# Patient Record
Sex: Female | Born: 1945 | Race: White | Hispanic: No
Health system: Southern US, Community
[De-identification: ages and names within clinical notes are randomized; demographics above are authoritative.]

## PROBLEM LIST (undated history)

## (undated) DIAGNOSIS — F329 Major depressive disorder, single episode, unspecified: Secondary | ICD-10-CM

## (undated) DIAGNOSIS — S7222XA Displaced subtrochanteric fracture of left femur, initial encounter for closed fracture: Secondary | ICD-10-CM

## (undated) DIAGNOSIS — H9190 Unspecified hearing loss, unspecified ear: Secondary | ICD-10-CM

## (undated) DIAGNOSIS — M1990 Unspecified osteoarthritis, unspecified site: Secondary | ICD-10-CM

## (undated) DIAGNOSIS — L509 Urticaria, unspecified: Secondary | ICD-10-CM

## (undated) DIAGNOSIS — I10 Essential (primary) hypertension: Secondary | ICD-10-CM

## (undated) DIAGNOSIS — F32A Depression, unspecified: Secondary | ICD-10-CM

## (undated) HISTORY — DX: Essential (primary) hypertension: I10

## (undated) HISTORY — DX: Displaced subtrochanteric fracture of left femur, initial encounter for closed fracture: S72.22XA

## (undated) HISTORY — PX: NO PAST SURGERIES: SHX2092

## (undated) HISTORY — DX: Urticaria, unspecified: L50.9

## (undated) HISTORY — DX: Depression, unspecified: F32.A

---
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
## 2009-07-17 ENCOUNTER — Encounter: Payer: Self-pay | Admitting: Physician Assistant

## 2014-04-03 ENCOUNTER — Telehealth: Payer: Self-pay | Admitting: Family Medicine

## 2015-02-14 ENCOUNTER — Encounter: Payer: Self-pay | Admitting: Physician Assistant

## 2015-07-04 DIAGNOSIS — J3089 Other allergic rhinitis: Secondary | ICD-10-CM | POA: Diagnosis not present

## 2015-07-18 DIAGNOSIS — J3089 Other allergic rhinitis: Secondary | ICD-10-CM | POA: Diagnosis not present

## 2015-08-01 DIAGNOSIS — J3089 Other allergic rhinitis: Secondary | ICD-10-CM | POA: Diagnosis not present

## 2015-08-15 DIAGNOSIS — J3089 Other allergic rhinitis: Secondary | ICD-10-CM | POA: Diagnosis not present

## 2015-08-22 DIAGNOSIS — J3089 Other allergic rhinitis: Secondary | ICD-10-CM | POA: Diagnosis not present

## 2015-09-06 DIAGNOSIS — J3089 Other allergic rhinitis: Secondary | ICD-10-CM | POA: Diagnosis not present

## 2015-09-19 DIAGNOSIS — J3089 Other allergic rhinitis: Secondary | ICD-10-CM | POA: Diagnosis not present

## 2015-09-26 DIAGNOSIS — J3089 Other allergic rhinitis: Secondary | ICD-10-CM | POA: Diagnosis not present

## 2015-10-10 DIAGNOSIS — J3089 Other allergic rhinitis: Secondary | ICD-10-CM | POA: Diagnosis not present

## 2015-10-25 DIAGNOSIS — J3089 Other allergic rhinitis: Secondary | ICD-10-CM | POA: Diagnosis not present

## 2015-10-31 DIAGNOSIS — J3089 Other allergic rhinitis: Secondary | ICD-10-CM | POA: Diagnosis not present

## 2015-11-06 DIAGNOSIS — H1045 Other chronic allergic conjunctivitis: Secondary | ICD-10-CM | POA: Diagnosis not present

## 2015-11-06 DIAGNOSIS — J3089 Other allergic rhinitis: Secondary | ICD-10-CM | POA: Diagnosis not present

## 2015-11-06 DIAGNOSIS — R05 Cough: Secondary | ICD-10-CM | POA: Diagnosis not present

## 2015-11-06 DIAGNOSIS — R21 Rash and other nonspecific skin eruption: Secondary | ICD-10-CM | POA: Diagnosis not present

## 2015-11-18 DIAGNOSIS — J3089 Other allergic rhinitis: Secondary | ICD-10-CM | POA: Diagnosis not present

## 2015-11-28 DIAGNOSIS — J3089 Other allergic rhinitis: Secondary | ICD-10-CM | POA: Diagnosis not present

## 2015-12-06 DIAGNOSIS — J3089 Other allergic rhinitis: Secondary | ICD-10-CM | POA: Diagnosis not present

## 2015-12-09 DIAGNOSIS — J3089 Other allergic rhinitis: Secondary | ICD-10-CM | POA: Diagnosis not present

## 2015-12-19 DIAGNOSIS — J3089 Other allergic rhinitis: Secondary | ICD-10-CM | POA: Diagnosis not present

## 2015-12-25 ENCOUNTER — Encounter: Payer: Self-pay | Admitting: Physician Assistant

## 2015-12-25 DIAGNOSIS — H538 Other visual disturbances: Secondary | ICD-10-CM | POA: Diagnosis not present

## 2015-12-25 DIAGNOSIS — H04123 Dry eye syndrome of bilateral lacrimal glands: Secondary | ICD-10-CM | POA: Diagnosis not present

## 2016-01-02 DIAGNOSIS — J3089 Other allergic rhinitis: Secondary | ICD-10-CM | POA: Diagnosis not present

## 2016-01-09 DIAGNOSIS — J3089 Other allergic rhinitis: Secondary | ICD-10-CM | POA: Diagnosis not present

## 2016-01-23 DIAGNOSIS — J3089 Other allergic rhinitis: Secondary | ICD-10-CM | POA: Diagnosis not present

## 2016-02-04 DIAGNOSIS — J3089 Other allergic rhinitis: Secondary | ICD-10-CM | POA: Diagnosis not present

## 2016-02-13 DIAGNOSIS — J3089 Other allergic rhinitis: Secondary | ICD-10-CM | POA: Diagnosis not present

## 2016-02-20 DIAGNOSIS — J3089 Other allergic rhinitis: Secondary | ICD-10-CM | POA: Diagnosis not present

## 2016-02-27 DIAGNOSIS — J3089 Other allergic rhinitis: Secondary | ICD-10-CM | POA: Diagnosis not present

## 2016-03-05 DIAGNOSIS — J3089 Other allergic rhinitis: Secondary | ICD-10-CM | POA: Diagnosis not present

## 2016-03-17 DIAGNOSIS — J3089 Other allergic rhinitis: Secondary | ICD-10-CM | POA: Diagnosis not present

## 2016-03-17 DIAGNOSIS — H1045 Other chronic allergic conjunctivitis: Secondary | ICD-10-CM | POA: Diagnosis not present

## 2016-03-17 DIAGNOSIS — R05 Cough: Secondary | ICD-10-CM | POA: Diagnosis not present

## 2016-03-17 DIAGNOSIS — R21 Rash and other nonspecific skin eruption: Secondary | ICD-10-CM | POA: Diagnosis not present

## 2016-03-19 ENCOUNTER — Other Ambulatory Visit: Payer: Self-pay | Admitting: Physician Assistant

## 2016-03-26 DIAGNOSIS — J3089 Other allergic rhinitis: Secondary | ICD-10-CM | POA: Diagnosis not present

## 2016-04-06 ENCOUNTER — Other Ambulatory Visit: Payer: Self-pay | Admitting: Physician Assistant

## 2016-04-10 DIAGNOSIS — J3089 Other allergic rhinitis: Secondary | ICD-10-CM | POA: Diagnosis not present

## 2016-04-16 DIAGNOSIS — J3089 Other allergic rhinitis: Secondary | ICD-10-CM | POA: Diagnosis not present

## 2016-04-30 DIAGNOSIS — J3089 Other allergic rhinitis: Secondary | ICD-10-CM | POA: Diagnosis not present

## 2016-05-07 DIAGNOSIS — J3089 Other allergic rhinitis: Secondary | ICD-10-CM | POA: Diagnosis not present

## 2016-05-18 ENCOUNTER — Other Ambulatory Visit: Payer: Self-pay | Admitting: Physician Assistant

## 2016-05-22 DIAGNOSIS — J3089 Other allergic rhinitis: Secondary | ICD-10-CM | POA: Diagnosis not present

## 2016-05-28 DIAGNOSIS — J3089 Other allergic rhinitis: Secondary | ICD-10-CM | POA: Diagnosis not present

## 2016-06-11 DIAGNOSIS — J3089 Other allergic rhinitis: Secondary | ICD-10-CM | POA: Diagnosis not present

## 2016-06-22 DIAGNOSIS — J3089 Other allergic rhinitis: Secondary | ICD-10-CM | POA: Diagnosis not present

## 2016-07-10 DIAGNOSIS — J3089 Other allergic rhinitis: Secondary | ICD-10-CM | POA: Diagnosis not present

## 2016-07-20 ENCOUNTER — Other Ambulatory Visit: Payer: Self-pay | Admitting: Physician Assistant

## 2016-07-23 DIAGNOSIS — J3089 Other allergic rhinitis: Secondary | ICD-10-CM | POA: Diagnosis not present

## 2016-07-31 DIAGNOSIS — J3089 Other allergic rhinitis: Secondary | ICD-10-CM | POA: Diagnosis not present

## 2016-08-03 ENCOUNTER — Other Ambulatory Visit: Payer: Self-pay | Admitting: Physician Assistant

## 2016-08-04 ENCOUNTER — Other Ambulatory Visit: Payer: Self-pay | Admitting: Physician Assistant

## 2016-08-06 DIAGNOSIS — J3089 Other allergic rhinitis: Secondary | ICD-10-CM | POA: Diagnosis not present

## 2016-08-12 DIAGNOSIS — J3089 Other allergic rhinitis: Secondary | ICD-10-CM | POA: Diagnosis not present

## 2016-08-20 DIAGNOSIS — J3089 Other allergic rhinitis: Secondary | ICD-10-CM | POA: Diagnosis not present

## 2016-08-21 ENCOUNTER — Other Ambulatory Visit: Payer: Self-pay | Admitting: Physician Assistant

## 2016-09-03 ENCOUNTER — Other Ambulatory Visit: Payer: Self-pay | Admitting: Physician Assistant

## 2016-09-03 DIAGNOSIS — J3089 Other allergic rhinitis: Secondary | ICD-10-CM | POA: Diagnosis not present

## 2016-09-17 DIAGNOSIS — J3089 Other allergic rhinitis: Secondary | ICD-10-CM | POA: Diagnosis not present

## 2016-09-18 ENCOUNTER — Other Ambulatory Visit: Payer: Self-pay | Admitting: Physician Assistant

## 2016-09-29 ENCOUNTER — Other Ambulatory Visit: Payer: Self-pay | Admitting: Physician Assistant

## 2016-10-02 DIAGNOSIS — J3089 Other allergic rhinitis: Secondary | ICD-10-CM | POA: Diagnosis not present

## 2016-10-13 DIAGNOSIS — J3089 Other allergic rhinitis: Secondary | ICD-10-CM | POA: Diagnosis not present

## 2016-10-19 DIAGNOSIS — J3089 Other allergic rhinitis: Secondary | ICD-10-CM | POA: Diagnosis not present

## 2016-10-23 ENCOUNTER — Other Ambulatory Visit: Payer: Self-pay | Admitting: Physician Assistant

## 2016-10-30 ENCOUNTER — Other Ambulatory Visit: Payer: Self-pay | Admitting: Physician Assistant

## 2016-11-04 ENCOUNTER — Ambulatory Visit: Payer: Self-pay | Admitting: Physician Assistant

## 2016-11-05 DIAGNOSIS — J3089 Other allergic rhinitis: Secondary | ICD-10-CM | POA: Diagnosis not present

## 2016-11-12 ENCOUNTER — Ambulatory Visit (INDEPENDENT_AMBULATORY_CARE_PROVIDER_SITE_OTHER): Payer: Medicare Other | Admitting: Physician Assistant

## 2016-11-12 ENCOUNTER — Encounter: Payer: Self-pay | Admitting: Physician Assistant

## 2016-11-12 VITALS — BP 136/84 | HR 71 | Temp 97.3°F | Ht 62.0 in | Wt 155.0 lb

## 2016-11-12 DIAGNOSIS — K219 Gastro-esophageal reflux disease without esophagitis: Secondary | ICD-10-CM | POA: Diagnosis not present

## 2016-11-12 DIAGNOSIS — Z0000 Encounter for general adult medical examination without abnormal findings: Secondary | ICD-10-CM

## 2016-11-12 DIAGNOSIS — M2170 Unequal limb length (acquired), unspecified site: Secondary | ICD-10-CM | POA: Insufficient documentation

## 2016-11-12 DIAGNOSIS — J3089 Other allergic rhinitis: Secondary | ICD-10-CM

## 2016-11-12 DIAGNOSIS — F339 Major depressive disorder, recurrent, unspecified: Secondary | ICD-10-CM

## 2016-11-12 DIAGNOSIS — I10 Essential (primary) hypertension: Secondary | ICD-10-CM

## 2016-11-12 DIAGNOSIS — L719 Rosacea, unspecified: Secondary | ICD-10-CM

## 2016-11-12 DIAGNOSIS — M41115 Juvenile idiopathic scoliosis, thoracolumbar region: Secondary | ICD-10-CM

## 2016-11-12 MED ORDER — CITALOPRAM HYDROBROMIDE 40 MG PO TABS: 60.0000 mg | ORAL_TABLET | Freq: Every day | ORAL | 3 refills | Status: DC

## 2016-11-12 MED ORDER — MELOXICAM 7.5 MG PO TABS: 7.5000 mg | ORAL_TABLET | Freq: Every day | ORAL | 1 refills | Status: DC

## 2016-11-12 MED ORDER — LOSARTAN POTASSIUM 25 MG PO TABS: 25.0000 mg | ORAL_TABLET | Freq: Every day | ORAL | 3 refills | Status: DC

## 2016-11-12 MED ORDER — TIZANIDINE HCL 4 MG PO TABS: 4.0000 mg | ORAL_TABLET | Freq: Three times a day (TID) | ORAL | 0 refills | Status: DC | PRN

## 2016-11-12 MED ORDER — OMEPRAZOLE 20 MG PO CPDR: DELAYED_RELEASE_CAPSULE | ORAL | 3 refills | Status: DC

## 2016-11-12 MED ORDER — METRONIDAZOLE 0.75 % EX CREA: TOPICAL_CREAM | Freq: Two times a day (BID) | CUTANEOUS | 11 refills | Status: DC

## 2016-11-12 NOTE — Patient Instructions (Signed)
Stress and Stress Management Stress is a normal reaction to life events. It is what you feel when life demands more than you are used to or more than you can handle. Some stress can be useful. For example, the stress reaction can help you catch the last bus of the day, study for a test, or meet a deadline at work. But stress that occurs too often or for too long can cause problems. It can affect your emotional health and interfere with relationships and normal daily activities. Too much stress can weaken your immune system and increase your risk for physical illness. If you already have a medical problem, stress can make it worse. What are the causes? All sorts of life events may cause stress. An event that causes stress for one person may not be stressful for another person. Major life events commonly cause stress. These may be positive or negative. Examples include losing your job, moving into a new home, getting married, having a baby, or losing a loved one. Less obvious life events may also cause stress, especially if they occur day after day or in combination. Examples include working long hours, driving in traffic, caring for children, being in debt, or being in a difficult relationship. What are the signs or symptoms? Stress may cause emotional symptoms including, the following:  Anxiety. This is feeling worried, afraid, on edge, overwhelmed, or out of control.  Anger. This is feeling irritated or impatient.  Depression. This is feeling sad, down, helpless, or guilty.  Difficulty focusing, remembering, or making decisions.  Stress may cause physical symptoms, including the following:  Aches and pains. These may affect your head, neck, back, stomach, or other areas of your body.  Tight muscles or clenched jaw.  Low energy or trouble sleeping.  Stress may cause unhealthy behaviors, including the following:  Eating to feel better (overeating) or skipping meals.  Sleeping too little,  too much, or both.  Working too much or putting off tasks (procrastination).  Smoking, drinking alcohol, or using drugs to feel better.  How is this diagnosed? Stress is diagnosed through an assessment by your health care provider. Your health care provider will ask questions about your symptoms and any stressful life events.Your health care provider will also ask about your medical history and may order blood tests or other tests. Certain medical conditions and medicine can cause physical symptoms similar to stress. Mental illness can cause emotional symptoms and unhealthy behaviors similar to stress. Your health care provider may refer you to a mental health professional for further evaluation. How is this treated? Stress management is the recommended treatment for stress.The goals of stress management are reducing stressful life events and coping with stress in healthy ways. Techniques for reducing stressful life events include the following:  Stress identification. Self-monitor for stress and identify what causes stress for you. These skills may help you to avoid some stressful events.  Time management. Set your priorities, keep a calendar of events, and learn to say "no." These tools can help you avoid making too many commitments.  Techniques for coping with stress include the following:  Rethinking the problem. Try to think realistically about stressful events rather than ignoring them or overreacting. Try to find the positives in a stressful situation rather than focusing on the negatives.  Exercise. Physical exercise can release both physical and emotional tension. The key is to find a form of exercise you enjoy and do it regularly.  Relaxation techniques. These relax the body and  mind. Examples include yoga, meditation, tai chi, biofeedback, deep breathing, progressive muscle relaxation, listening to music, being out in nature, journaling, and other hobbies. Again, the key is to find  one or more that you enjoy and can do regularly.  Healthy lifestyle. Eat a balanced diet, get plenty of sleep, and do not smoke. Avoid using alcohol or drugs to relax.  Strong support network. Spend time with family, friends, or other people you enjoy being around.Express your feelings and talk things over with someone you trust.  Counseling or talktherapy with a mental health professional may be helpful if you are having difficulty managing stress on your own. Medicine is typically not recommended for the treatment of stress.Talk to your health care provider if you think you need medicine for symptoms of stress. Follow these instructions at home:  Keep all follow-up visits as directed by your health care provider.  Take all medicines as directed by your health care provider. Contact a health care provider if:  Your symptoms get worse or you start having new symptoms.  You feel overwhelmed by your problems and can no longer manage them on your own. Get help right away if:  You feel like hurting yourself or someone else. This information is not intended to replace advice given to you by your health care provider. Make sure you discuss any questions you have with your health care provider. Document Released: 11/11/2000 Document Revised: 10/24/2015 Document Reviewed: 01/10/2013 Elsevier Interactive Patient Education  2017 [employer] Inc.  

## 2016-11-13 LAB — CMP14+EGFR
ALT: 15 IU/L (ref 0–32)
AST: 20 IU/L (ref 0–40)
Albumin/Globulin Ratio: 1.7 (ref 1.2–2.2)
Albumin: 4.6 g/dL (ref 3.5–4.8)
Alkaline Phosphatase: 71 IU/L (ref 39–117)
BUN/Creatinine Ratio: 16 (ref 12–28)
BUN: 12 mg/dL (ref 8–27)
Bilirubin Total: 0.5 mg/dL (ref 0.0–1.2)
CO2: 23 mmol/L (ref 20–29)
Calcium: 9.7 mg/dL (ref 8.7–10.3)
Chloride: 102 mmol/L (ref 96–106)
Creatinine, Ser: 0.76 mg/dL (ref 0.57–1.00)
GFR calc Af Amer: 91 mL/min/{1.73_m2} (ref >59)
GFR calc non Af Amer: 79 mL/min/{1.73_m2} (ref >59)
Globulin, Total: 2.7 g/dL (ref 1.5–4.5)
Glucose: 80 mg/dL (ref 65–99)
Potassium: 4.1 mmol/L (ref 3.5–5.2)
Sodium: 141 mmol/L (ref 134–144)
Total Protein: 7.3 g/dL (ref 6.0–8.5)

## 2016-11-13 LAB — CBC WITH DIFFERENTIAL/PLATELET
Basophils Absolute: 0.1 10*3/uL (ref 0.0–0.2)
Basos: 1 %
EOS (ABSOLUTE): 0.1 10*3/uL (ref 0.0–0.4)
Eos: 1 %
Hematocrit: 41 % (ref 34.0–46.6)
Hemoglobin: 13.5 g/dL (ref 11.1–15.9)
Immature Grans (Abs): 0 10*3/uL (ref 0.0–0.1)
Immature Granulocytes: 0 %
Lymphocytes Absolute: 1.9 10*3/uL (ref 0.7–3.1)
Lymphs: 27 %
MCH: 29 pg (ref 26.6–33.0)
MCHC: 32.9 g/dL (ref 31.5–35.7)
MCV: 88 fL (ref 79–97)
Monocytes Absolute: 0.8 10*3/uL (ref 0.1–0.9)
Monocytes: 11 %
Neutrophils Absolute: 4.3 10*3/uL (ref 1.4–7.0)
Neutrophils: 60 %
Platelets: 233 10*3/uL (ref 150–379)
RBC: 4.65 x10E6/uL (ref 3.77–5.28)
RDW: 13.6 % (ref 12.3–15.4)
WBC: 7.2 10*3/uL (ref 3.4–10.8)

## 2016-11-13 LAB — LIPID PANEL
Chol/HDL Ratio: 3.7 ratio (ref 0.0–4.4)
Cholesterol, Total: 180 mg/dL (ref 100–199)
HDL: 49 mg/dL (ref >39)
LDL Calculated: 93 mg/dL (ref 0–99)
Triglycerides: 191 mg/dL — ABNORMAL HIGH (ref 0–149)
VLDL Cholesterol Cal: 38 mg/dL (ref 5–40)

## 2016-11-13 LAB — TSH: TSH: 2.37 u[IU]/mL (ref 0.450–4.500)

## 2016-11-15 NOTE — Progress Notes (Signed)
BP 136/84   Pulse 71   Temp 97.3 F (36.3 C) (Oral)   Ht _0  (1.575 m)   Wt 155 lb (70.3 kg)   BMI 28.35 kg/m    Subjective:    Patient ID: Savannah Dean, female    DOB: 15-Aug-1945, 71 y.o.   MRN: [id number]  HPI: Savannah Dean is a 72 y.o. female presenting on 11/12/2016 for Establish Care  This patient comes in for periodic recheck on medications and conditions including scoliosis, chronic pain, depression, GERD, hypertension, acne rosacea, allergic rhinitis,.  She needs labs updated and her medications refilled.   All medications are reviewed today. There are no reports of any problems with the medications. All of the medical conditions are reviewed and updated.  Lab work is reviewed and will be ordered as medically necessary. There are no new problems reported with today's visit.   Relevant past medical, surgical, family and social history reviewed and updated as indicated. Allergies and medications reviewed and updated.  No past medical history on file.  Past Surgical History:  Procedure Laterality Date  . SPINAL FUSION  1950    Review of Systems  Constitutional: Negative.  Negative for activity change, fatigue and fever.  HENT: Positive for congestion and postnasal drip.   Eyes: Negative.   Respiratory: Negative.  Negative for cough.   Cardiovascular: Negative.  Negative for chest pain.  Gastrointestinal: Negative.  Negative for abdominal pain.  Endocrine: Negative.   Genitourinary: Negative.  Negative for dysuria.  Musculoskeletal: Positive for arthralgias, gait problem and joint swelling.  Skin: Negative.     Allergies as of 11/12/2016   Not on File     Medication List       Accurate as of 11/12/16 11:59 PM. Always use your most recent med list.          citalopram 40 MG tablet Commonly known as:  CELEXA Take 1.5 tablets (60 mg total) by mouth daily.   losartan 25 MG tablet Commonly known as:  COZAAR Take 1 tablet (25 mg total) by mouth  daily.   meloxicam 7.5 MG tablet Commonly known as:  MOBIC Take 1 tablet (7.5 mg total) by mouth daily.   metroNIDAZOLE 0.75 % cream Commonly known as:  METROCREAM Apply topically 2 (two) times daily.   omeprazole 20 MG capsule Commonly known as:  PRILOSEC TAKE ONE (1) CAPSULE EACH DAY   tiZANidine 4 MG tablet Commonly known as:  ZANAFLEX Take 1 tablet (4 mg total) by mouth every 8 (eight) hours as needed.          Objective:    BP 136/84   Pulse 71   Temp 97.3 F (36.3 C) (Oral)   Ht _1  (1.575 m)   Wt 155 lb (70.3 kg)   BMI 28.35 kg/m   Not on File  Physical Exam  Constitutional: She is oriented to person, place, and time. She appears well-developed and well-nourished.  HENT:  Head: Normocephalic and atraumatic.  Right Ear: Tympanic membrane, external ear and ear canal normal.  Left Ear: Tympanic membrane, external ear and ear canal normal.  Nose: Nose normal. No rhinorrhea.  Mouth/Throat: Oropharynx is clear and moist and mucous membranes are normal. No oropharyngeal exudate or posterior oropharyngeal erythema.  Eyes: Conjunctivae and EOM are normal. Pupils are equal, round, and reactive to light.  Neck: Normal range of motion. Neck supple.  Cardiovascular: Normal rate, regular rhythm, normal heart sounds and intact distal pulses.   Pulmonary/Chest:  Effort normal and breath sounds normal.  Abdominal: Soft. Bowel sounds are normal.  Neurological: She is alert and oriented to person, place, and time. She has normal reflexes.  Skin: Skin is warm and dry. No rash noted.  Psychiatric: She has a normal mood and affect. Her behavior is normal. Judgment and thought content normal.  Nursing note and vitals reviewed.   Results for orders placed or performed in visit on 11/12/16  CBC with Differential/Platelet  Result Value Ref Range   WBC 7.2 3.4 - 10.8 x10E3/uL   RBC 4.65 3.77 - 5.28 x10E6/uL   Hemoglobin 13.5 11.1 - 15.9 g/dL   Hematocrit 41.0 34.0 - 46.6 %    MCV 88 79 - 97 fL   MCH 29.0 26.6 - 33.0 pg   MCHC 32.9 31.5 - 35.7 g/dL   RDW 13.6 12.3 - 15.4 %   Platelets 233 150 - 379 x10E3/uL   Neutrophils 60 Not Estab. %   Lymphs 27 Not Estab. %   Monocytes 11 Not Estab. %   Eos 1 Not Estab. %   Basos 1 Not Estab. %   Neutrophils Absolute 4.3 1.4 - 7.0 x10E3/uL   Lymphocytes Absolute 1.9 0.7 - 3.1 x10E3/uL   Monocytes Absolute 0.8 0.1 - 0.9 x10E3/uL   EOS (ABSOLUTE) 0.1 0.0 - 0.4 x10E3/uL   Basophils Absolute 0.1 0.0 - 0.2 x10E3/uL   Immature Granulocytes 0 Not Estab. %   Immature Grans (Abs) 0.0 0.0 - 0.1 x10E3/uL  CMP14+EGFR  Result Value Ref Range   Glucose 80 65 - 99 mg/dL   BUN 12 8 - 27 mg/dL   Creatinine, Ser 0.76 0.57 - 1.00 mg/dL   GFR calc non Af Amer 79 >59 mL/min/1.73   GFR calc Af Amer 91 >59 mL/min/1.73   BUN/Creatinine Ratio 16 12 - 28   Sodium 141 134 - 144 mmol/L   Potassium 4.1 3.5 - 5.2 mmol/L   Chloride 102 96 - 106 mmol/L   CO2 23 20 - 29 mmol/L   Calcium 9.7 8.7 - 10.3 mg/dL   Total Protein 7.3 6.0 - 8.5 g/dL   Albumin 4.6 3.5 - 4.8 g/dL   Globulin, Total 2.7 1.5 - 4.5 g/dL   Albumin/Globulin Ratio 1.7 1.2 - 2.2   Bilirubin Total 0.5 0.0 - 1.2 mg/dL   Alkaline Phosphatase 71 39 - 117 IU/L   AST 20 0 - 40 IU/L   ALT 15 0 - 32 IU/L  Lipid panel  Result Value Ref Range   Cholesterol, Total 180 100 - 199 mg/dL   Triglycerides 191 (H) 0 - 149 mg/dL   HDL 49 >39 mg/dL   VLDL Cholesterol Cal 38 5 - 40 mg/dL   LDL Calculated 93 0 - 99 mg/dL   Chol/HDL Ratio 3.7 0.0 - 4.4 ratio  TSH  Result Value Ref Range   TSH 2.370 0.450 - 4.500 uIU/mL      Assessment & Plan:   1. Essential hypertension - losartan (COZAAR) 25 MG tablet; Take 1 tablet (25 mg total) by mouth daily.  Dispense: 90 tablet; Refill: 3 - CBC with Differential/Platelet - CMP14+EGFR - Lipid panel - TSH  2. Depression, recurrent ([place]) - citalopram (CELEXA) 40 MG tablet; Take 1.5 tablets (60 mg total) by mouth daily.  Dispense: 90 tablet;  Refill: 3  3. Gastroesophageal reflux disease without esophagitis - omeprazole (PRILOSEC) 20 MG capsule; TAKE ONE (1) CAPSULE EACH DAY  Dispense: 90 capsule; Refill: 3 - CBC with Differential/Platelet - CMP14+EGFR  4. Juvenile idiopathic scoliosis of thoracolumbar region - meloxicam (MOBIC) 7.5 MG tablet; Take 1 tablet (7.5 mg total) by mouth daily.  Dispense: 90 tablet; Refill: 1 - tiZANidine (ZANAFLEX) 4 MG tablet; Take 1 tablet (4 mg total) by mouth every 8 (eight) hours as needed.  Dispense: 30 tablet; Refill: 0  5. Leg length discrepancy  6. Acne rosacea - metroNIDAZOLE (METROCREAM) 0.75 % cream; Apply topically 2 (two) times daily.  Dispense: 45 g; Refill: 11  7. Non-seasonal allergic rhinitis, unspecified trigger  8. Well adult exam - CBC with Differential/Platelet - CMP14+EGFR - Lipid panel - TSH   Current Outpatient Prescriptions:  .  citalopram (CELEXA) 40 MG tablet, Take 1.5 tablets (60 mg total) by mouth daily., Disp: 90 tablet, Rfl: 3 .  losartan (COZAAR) 25 MG tablet, Take 1 tablet (25 mg total) by mouth daily., Disp: 90 tablet, Rfl: 3 .  meloxicam (MOBIC) 7.5 MG tablet, Take 1 tablet (7.5 mg total) by mouth daily., Disp: 90 tablet, Rfl: 1 .  metroNIDAZOLE (METROCREAM) 0.75 % cream, Apply topically 2 (two) times daily., Disp: 45 g, Rfl: 11 .  omeprazole (PRILOSEC) 20 MG capsule, TAKE ONE (1) CAPSULE EACH DAY, Disp: 90 capsule, Rfl: 3 .  tiZANidine (ZANAFLEX) 4 MG tablet, Take 1 tablet (4 mg total) by mouth every 8 (eight) hours as needed., Disp: 30 tablet, Rfl: 0  Continue all other maintenance medications as listed above.  Follow up plan: Return in about 6 months (around 05/14/2017) for recheck.  Educational handout given for [place] PA-C [place] [address]  [place], [place] [address] [phone]   11/15/2016, 10:36 PM

## 2016-11-19 DIAGNOSIS — J3089 Other allergic rhinitis: Secondary | ICD-10-CM | POA: Diagnosis not present

## 2016-12-03 DIAGNOSIS — J3089 Other allergic rhinitis: Secondary | ICD-10-CM | POA: Diagnosis not present

## 2016-12-17 ENCOUNTER — Telehealth: Payer: Self-pay | Admitting: Physician Assistant

## 2016-12-17 DIAGNOSIS — J3089 Other allergic rhinitis: Secondary | ICD-10-CM | POA: Diagnosis not present

## 2016-12-17 NOTE — Telephone Encounter (Signed)
Patient advised that she has refills at the drug store.

## 2016-12-17 NOTE — Telephone Encounter (Signed)
What is the name of the medication? citalopram  Have you contacted your pharmacy to request a refill? yes  Which pharmacy would you like this sent to? The drug store in [place].   Patient notified that their request is being sent to the clinical staff for review and that they should receive a call once it is complete. If they do not receive a call within 24 hours they can check with their pharmacy or our office.

## 2016-12-31 DIAGNOSIS — J3089 Other allergic rhinitis: Secondary | ICD-10-CM | POA: Diagnosis not present

## 2017-01-14 DIAGNOSIS — J3089 Other allergic rhinitis: Secondary | ICD-10-CM | POA: Diagnosis not present

## 2017-01-21 DIAGNOSIS — J3089 Other allergic rhinitis: Secondary | ICD-10-CM | POA: Diagnosis not present

## 2017-02-03 ENCOUNTER — Other Ambulatory Visit: Payer: Self-pay | Admitting: Physician Assistant

## 2017-02-03 DIAGNOSIS — M41115 Juvenile idiopathic scoliosis, thoracolumbar region: Secondary | ICD-10-CM

## 2017-02-04 DIAGNOSIS — J3089 Other allergic rhinitis: Secondary | ICD-10-CM | POA: Diagnosis not present

## 2017-02-05 ENCOUNTER — Other Ambulatory Visit: Payer: Self-pay | Admitting: Physician Assistant

## 2017-02-11 DIAGNOSIS — J3089 Other allergic rhinitis: Secondary | ICD-10-CM | POA: Diagnosis not present

## 2017-02-18 DIAGNOSIS — J3089 Other allergic rhinitis: Secondary | ICD-10-CM | POA: Diagnosis not present

## 2017-02-25 DIAGNOSIS — J3089 Other allergic rhinitis: Secondary | ICD-10-CM | POA: Diagnosis not present

## 2017-03-04 DIAGNOSIS — J3089 Other allergic rhinitis: Secondary | ICD-10-CM | POA: Diagnosis not present

## 2017-03-16 DIAGNOSIS — J3089 Other allergic rhinitis: Secondary | ICD-10-CM | POA: Diagnosis not present

## 2017-03-16 DIAGNOSIS — R21 Rash and other nonspecific skin eruption: Secondary | ICD-10-CM | POA: Diagnosis not present

## 2017-03-16 DIAGNOSIS — R05 Cough: Secondary | ICD-10-CM | POA: Diagnosis not present

## 2017-03-16 DIAGNOSIS — H1045 Other chronic allergic conjunctivitis: Secondary | ICD-10-CM | POA: Diagnosis not present

## 2017-03-23 ENCOUNTER — Encounter: Payer: Self-pay | Admitting: Family Medicine

## 2017-03-23 DIAGNOSIS — Z0000 Encounter for general adult medical examination without abnormal findings: Secondary | ICD-10-CM | POA: Diagnosis not present

## 2017-03-23 DIAGNOSIS — Z1211 Encounter for screening for malignant neoplasm of colon: Secondary | ICD-10-CM | POA: Diagnosis not present

## 2017-04-01 ENCOUNTER — Other Ambulatory Visit: Payer: Self-pay | Admitting: Physician Assistant

## 2017-04-01 DIAGNOSIS — J3089 Other allergic rhinitis: Secondary | ICD-10-CM | POA: Diagnosis not present

## 2017-04-08 DIAGNOSIS — J3089 Other allergic rhinitis: Secondary | ICD-10-CM | POA: Diagnosis not present

## 2017-04-29 DIAGNOSIS — J3089 Other allergic rhinitis: Secondary | ICD-10-CM | POA: Diagnosis not present

## 2017-05-04 ENCOUNTER — Other Ambulatory Visit: Payer: Self-pay | Admitting: Physician Assistant

## 2017-05-04 DIAGNOSIS — M41115 Juvenile idiopathic scoliosis, thoracolumbar region: Secondary | ICD-10-CM

## 2017-05-13 ENCOUNTER — Other Ambulatory Visit: Payer: Self-pay | Admitting: Physician Assistant

## 2017-05-13 DIAGNOSIS — J3089 Other allergic rhinitis: Secondary | ICD-10-CM | POA: Diagnosis not present

## 2017-05-14 ENCOUNTER — Encounter: Payer: Self-pay | Admitting: Physician Assistant

## 2017-05-14 ENCOUNTER — Ambulatory Visit: Payer: Medicare Other | Admitting: Physician Assistant

## 2017-05-14 ENCOUNTER — Other Ambulatory Visit: Payer: Self-pay | Admitting: Physician Assistant

## 2017-05-14 DIAGNOSIS — M41115 Juvenile idiopathic scoliosis, thoracolumbar region: Secondary | ICD-10-CM

## 2017-05-14 DIAGNOSIS — F339 Major depressive disorder, recurrent, unspecified: Secondary | ICD-10-CM | POA: Diagnosis not present

## 2017-05-14 DIAGNOSIS — K219 Gastro-esophageal reflux disease without esophagitis: Secondary | ICD-10-CM

## 2017-05-14 DIAGNOSIS — I10 Essential (primary) hypertension: Secondary | ICD-10-CM

## 2017-05-14 MED ORDER — LOSARTAN POTASSIUM 25 MG PO TABS: 25.0000 mg | ORAL_TABLET | Freq: Every day | ORAL | 3 refills | Status: DC

## 2017-05-14 MED ORDER — MELOXICAM 7.5 MG PO TABS: 7.5000 mg | ORAL_TABLET | Freq: Every day | ORAL | 1 refills | Status: DC

## 2017-05-14 MED ORDER — NYSTATIN-TRIAMCINOLONE 100000-0.1 UNIT/GM-% EX CREA: TOPICAL_CREAM | CUTANEOUS | 5 refills | Status: DC

## 2017-05-14 MED ORDER — CITALOPRAM HYDROBROMIDE 40 MG PO TABS: 60.0000 mg | ORAL_TABLET | Freq: Every day | ORAL | 3 refills | Status: DC

## 2017-05-14 MED ORDER — OMEPRAZOLE 20 MG PO CPDR: DELAYED_RELEASE_CAPSULE | ORAL | 3 refills | Status: DC

## 2017-05-14 NOTE — Patient Instructions (Signed)
In a few days you may receive a survey in the mail or online from [employer] regarding your visit with us today. Please take a moment to fill this out. Your feedback is very important to our whole office. It can help us better understand your needs as well as improve your experience and satisfaction. Thank you for taking your time to complete it. We care about you.  Aliany Fiorenza, PA-C  

## 2017-05-14 NOTE — Progress Notes (Signed)
BP 138/82   Pulse 67   Temp 97.9 F (36.6 C) (Oral)   Ht '5\' 2"'  (1.575 m)   Wt 155 lb 9.6 oz (70.6 kg)   BMI 28.46 kg/m    Subjective:    Patient ID: Savannah Dean, female    DOB: 1946/05/18, 71 y.o.   MRN: [id number]  HPI: Savannah Dean is a 71 y.o. female presenting on 05/14/2017 for Follow-up (6 month ) and Medication Refill  This patient comes in for periodic recheck on medications and conditions including GERD, scoliosis, hypertension, depression.  She is quite stable with all her conditions. There are some medications that need refills.   All medications are reviewed today. There are no reports of any problems with the medications. All of the medical conditions are reviewed and updated.  Lab work is reviewed and will be ordered as medically necessary. There are no new problems reported with today's visit.   Relevant past medical, surgical, family and social history reviewed and updated as indicated. Allergies and medications reviewed and updated.  History reviewed. No pertinent past medical history.  Past Surgical History:  Procedure Laterality Date  . SPINAL FUSION  1950    Review of Systems  Constitutional: Negative.  Negative for activity change, fatigue and fever.  HENT: Negative.   Eyes: Negative.   Respiratory: Negative.  Negative for cough.   Cardiovascular: Negative.  Negative for chest pain.  Gastrointestinal: Negative.  Negative for abdominal pain.  Endocrine: Negative.   Genitourinary: Negative.  Negative for dysuria.  Musculoskeletal: Negative.   Skin: Negative.   Neurological: Negative.     Allergies as of 05/14/2017   No Known Allergies     Medication List        Accurate as of 05/14/17  2:36 PM. Always use your most recent med list.          alendronate 70 MG tablet Commonly known as:  FOSAMAX TAKE 1 TABLET EVERY WEEK   citalopram 40 MG tablet Commonly known as:  CELEXA Take 1.5 tablets (60 mg total) by mouth daily.     losartan 25 MG tablet Commonly known as:  COZAAR Take 1 tablet (25 mg total) by mouth daily.   meloxicam 7.5 MG tablet Commonly known as:  MOBIC Take 1 tablet (7.5 mg total) by mouth daily.   nystatin-triamcinolone cream Commonly known as:  MYCOLOG II APPLY TO AFFECTED AREA TWICE A DAY   omeprazole 20 MG capsule Commonly known as:  PRILOSEC TAKE ONE (1) CAPSULE EACH DAY   tiZANidine 4 MG tablet Commonly known as:  ZANAFLEX TAKE ONE TABLET EVERY 8 HOURS AS NEEDED          Objective:    BP 138/82   Pulse 67   Temp 97.9 F (36.6 C) (Oral)   Ht '5\' 2"'  (1.575 m)   Wt 155 lb 9.6 oz (70.6 kg)   BMI 28.46 kg/m   No Known Allergies  Physical Exam  Constitutional: She is oriented to person, place, and time. She appears well-developed and well-nourished.  HENT:  Head: Normocephalic and atraumatic.  Eyes: Conjunctivae and EOM are normal. Pupils are equal, round, and reactive to light.  Cardiovascular: Normal rate, regular rhythm, normal heart sounds and intact distal pulses.  Pulmonary/Chest: Effort normal and breath sounds normal.  Abdominal: Soft. Bowel sounds are normal.  Neurological: She is alert and oriented to person, place, and time. She has normal reflexes.  Skin: Skin is warm and dry. No rash noted.  Psychiatric: She has a normal mood and affect. Her behavior is normal. Judgment and thought content normal.  Nursing note and vitals reviewed.   Results for orders placed or performed in visit on 11/12/16  CBC with Differential/Platelet  Result Value Ref Range   WBC 7.2 3.4 - 10.8 x10E3/uL   RBC 4.65 3.77 - 5.28 x10E6/uL   Hemoglobin 13.5 11.1 - 15.9 g/dL   Hematocrit 41.0 34.0 - 46.6 %   MCV 88 79 - 97 fL   MCH 29.0 26.6 - 33.0 pg   MCHC 32.9 31.5 - 35.7 g/dL   RDW 13.6 12.3 - 15.4 %   Platelets 233 150 - 379 x10E3/uL   Neutrophils 60 Not Estab. %   Lymphs 27 Not Estab. %   Monocytes 11 Not Estab. %   Eos 1 Not Estab. %   Basos 1 Not Estab. %    Neutrophils Absolute 4.3 1.4 - 7.0 x10E3/uL   Lymphocytes Absolute 1.9 0.7 - 3.1 x10E3/uL   Monocytes Absolute 0.8 0.1 - 0.9 x10E3/uL   EOS (ABSOLUTE) 0.1 0.0 - 0.4 x10E3/uL   Basophils Absolute 0.1 0.0 - 0.2 x10E3/uL   Immature Granulocytes 0 Not Estab. %   Immature Grans (Abs) 0.0 0.0 - 0.1 x10E3/uL  CMP14+EGFR  Result Value Ref Range   Glucose 80 65 - 99 mg/dL   BUN 12 8 - 27 mg/dL   Creatinine, Ser 0.76 0.57 - 1.00 mg/dL   GFR calc non Af Amer 79 >59 mL/min/1.73   GFR calc Af Amer 91 >59 mL/min/1.73   BUN/Creatinine Ratio 16 12 - 28   Sodium 141 134 - 144 mmol/L   Potassium 4.1 3.5 - 5.2 mmol/L   Chloride 102 96 - 106 mmol/L   CO2 23 20 - 29 mmol/L   Calcium 9.7 8.7 - 10.3 mg/dL   Total Protein 7.3 6.0 - 8.5 g/dL   Albumin 4.6 3.5 - 4.8 g/dL   Globulin, Total 2.7 1.5 - 4.5 g/dL   Albumin/Globulin Ratio 1.7 1.2 - 2.2   Bilirubin Total 0.5 0.0 - 1.2 mg/dL   Alkaline Phosphatase 71 39 - 117 IU/L   AST 20 0 - 40 IU/L   ALT 15 0 - 32 IU/L  Lipid panel  Result Value Ref Range   Cholesterol, Total 180 100 - 199 mg/dL   Triglycerides 191 (H) 0 - 149 mg/dL   HDL 49 >39 mg/dL   VLDL Cholesterol Cal 38 5 - 40 mg/dL   LDL Calculated 93 0 - 99 mg/dL   Chol/HDL Ratio 3.7 0.0 - 4.4 ratio  TSH  Result Value Ref Range   TSH 2.370 0.450 - 4.500 uIU/mL      Assessment & Plan:   1. Gastroesophageal reflux disease without esophagitis - omeprazole (PRILOSEC) 20 MG capsule; TAKE ONE (1) CAPSULE EACH DAY  Dispense: 90 capsule; Refill: 3  2. Juvenile idiopathic scoliosis of thoracolumbar region - meloxicam (MOBIC) 7.5 MG tablet; Take 1 tablet (7.5 mg total) by mouth daily.  Dispense: 90 tablet; Refill: 1  3. Essential hypertension - losartan (COZAAR) 25 MG tablet; Take 1 tablet (25 mg total) by mouth daily.  Dispense: 90 tablet; Refill: 3  4. Depression, recurrent ([place]) - citalopram (CELEXA) 40 MG tablet; Take 1.5 tablets (60 mg total) by mouth daily.  Dispense: 135 tablet; Refill:  3     Current Outpatient Medications:  .  alendronate (FOSAMAX) 70 MG tablet, TAKE 1 TABLET EVERY WEEK, Disp: 12 tablet, Rfl: 3 .  citalopram (  CELEXA) 40 MG tablet, Take 1.5 tablets (60 mg total) by mouth daily., Disp: 90 tablet, Rfl: 3 .  losartan (COZAAR) 25 MG tablet, Take 1 tablet (25 mg total) by mouth daily., Disp: 90 tablet, Rfl: 3 .  meloxicam (MOBIC) 7.5 MG tablet, Take 1 tablet (7.5 mg total) by mouth daily., Disp: 90 tablet, Rfl: 1 .  nystatin-triamcinolone (MYCOLOG II) cream, APPLY TO AFFECTED AREA TWICE A DAY, Disp: 60 g, Rfl: 0 .  omeprazole (PRILOSEC) 20 MG capsule, TAKE ONE (1) CAPSULE EACH DAY, Disp: 90 capsule, Rfl: 3 .  tiZANidine (ZANAFLEX) 4 MG tablet, TAKE ONE TABLET EVERY 8 HOURS AS NEEDED, Disp: 90 tablet, Rfl: 5 Continue all other maintenance medications as listed above.  Follow up plan: Recheck 6 month  Educational handout given for [place] PA-C [place] [address]  [place], [place] [address] [phone]   05/14/2017, 2:36 PM

## 2017-05-19 DIAGNOSIS — J3089 Other allergic rhinitis: Secondary | ICD-10-CM | POA: Diagnosis not present

## 2017-05-20 ENCOUNTER — Telehealth: Payer: Self-pay

## 2017-05-20 MED ORDER — KETOCONAZOLE 2 % EX CREA: 1.0000 "application " | TOPICAL_CREAM | Freq: Two times a day (BID) | CUTANEOUS | 0 refills | Status: DC

## 2017-05-20 NOTE — Telephone Encounter (Signed)
INsurance denied  Nystatin triamcinoline cream   Alternatives are nystatin cream ketoconazole 2% cream

## 2017-05-31 DIAGNOSIS — J3089 Other allergic rhinitis: Secondary | ICD-10-CM | POA: Diagnosis not present

## 2017-06-02 DIAGNOSIS — J3089 Other allergic rhinitis: Secondary | ICD-10-CM | POA: Diagnosis not present

## 2017-06-16 DIAGNOSIS — J3089 Other allergic rhinitis: Secondary | ICD-10-CM | POA: Diagnosis not present

## 2017-07-01 DIAGNOSIS — J3089 Other allergic rhinitis: Secondary | ICD-10-CM | POA: Diagnosis not present

## 2017-07-14 DIAGNOSIS — J3089 Other allergic rhinitis: Secondary | ICD-10-CM | POA: Diagnosis not present

## 2017-07-29 DIAGNOSIS — J301 Allergic rhinitis due to pollen: Secondary | ICD-10-CM | POA: Diagnosis not present

## 2017-08-05 DIAGNOSIS — J3089 Other allergic rhinitis: Secondary | ICD-10-CM | POA: Diagnosis not present

## 2017-08-05 DIAGNOSIS — J301 Allergic rhinitis due to pollen: Secondary | ICD-10-CM | POA: Diagnosis not present

## 2017-08-23 DIAGNOSIS — J3089 Other allergic rhinitis: Secondary | ICD-10-CM | POA: Diagnosis not present

## 2017-08-30 DIAGNOSIS — J3089 Other allergic rhinitis: Secondary | ICD-10-CM | POA: Diagnosis not present

## 2017-09-09 DIAGNOSIS — J3089 Other allergic rhinitis: Secondary | ICD-10-CM | POA: Diagnosis not present

## 2017-09-13 ENCOUNTER — Encounter: Payer: Self-pay | Admitting: *Deleted

## 2017-09-23 DIAGNOSIS — J3089 Other allergic rhinitis: Secondary | ICD-10-CM | POA: Diagnosis not present

## 2017-10-07 DIAGNOSIS — J3089 Other allergic rhinitis: Secondary | ICD-10-CM | POA: Diagnosis not present

## 2017-10-14 DIAGNOSIS — J3089 Other allergic rhinitis: Secondary | ICD-10-CM | POA: Diagnosis not present

## 2017-10-21 DIAGNOSIS — J301 Allergic rhinitis due to pollen: Secondary | ICD-10-CM | POA: Diagnosis not present

## 2017-10-21 DIAGNOSIS — J3089 Other allergic rhinitis: Secondary | ICD-10-CM | POA: Diagnosis not present

## 2017-11-05 DIAGNOSIS — J301 Allergic rhinitis due to pollen: Secondary | ICD-10-CM | POA: Diagnosis not present

## 2017-11-16 ENCOUNTER — Ambulatory Visit (INDEPENDENT_AMBULATORY_CARE_PROVIDER_SITE_OTHER): Payer: Medicare Other | Admitting: Physician Assistant

## 2017-11-16 ENCOUNTER — Encounter: Payer: Self-pay | Admitting: Physician Assistant

## 2017-11-16 VITALS — BP 138/85 | HR 62 | Temp 97.2°F | Ht 62.0 in | Wt 151.0 lb

## 2017-11-16 DIAGNOSIS — K219 Gastro-esophageal reflux disease without esophagitis: Secondary | ICD-10-CM

## 2017-11-16 DIAGNOSIS — Z0000 Encounter for general adult medical examination without abnormal findings: Secondary | ICD-10-CM

## 2017-11-16 DIAGNOSIS — M41115 Juvenile idiopathic scoliosis, thoracolumbar region: Secondary | ICD-10-CM

## 2017-11-16 DIAGNOSIS — I10 Essential (primary) hypertension: Secondary | ICD-10-CM

## 2017-11-16 MED ORDER — LOSARTAN POTASSIUM 25 MG PO TABS: 25.0000 mg | ORAL_TABLET | Freq: Every day | ORAL | 3 refills | Status: DC

## 2017-11-16 MED ORDER — TIZANIDINE HCL 4 MG PO TABS: ORAL_TABLET | ORAL | 5 refills | Status: DC

## 2017-11-17 LAB — LIPID PANEL
Chol/HDL Ratio: 3.6 ratio (ref 0.0–4.4)
Cholesterol, Total: 168 mg/dL (ref 100–199)
HDL: 47 mg/dL (ref >39)
LDL Calculated: 88 mg/dL (ref 0–99)
Triglycerides: 167 mg/dL — ABNORMAL HIGH (ref 0–149)
VLDL Cholesterol Cal: 33 mg/dL (ref 5–40)

## 2017-11-17 LAB — CBC WITH DIFFERENTIAL/PLATELET
Basophils Absolute: 0 10*3/uL (ref 0.0–0.2)
Basos: 1 %
EOS (ABSOLUTE): 0.1 10*3/uL (ref 0.0–0.4)
Eos: 2 %
Hematocrit: 37.3 % (ref 34.0–46.6)
Hemoglobin: 12.4 g/dL (ref 11.1–15.9)
Immature Grans (Abs): 0 10*3/uL (ref 0.0–0.1)
Immature Granulocytes: 0 %
Lymphocytes Absolute: 1.9 10*3/uL (ref 0.7–3.1)
Lymphs: 33 %
MCH: 29.2 pg (ref 26.6–33.0)
MCHC: 33.2 g/dL (ref 31.5–35.7)
MCV: 88 fL (ref 79–97)
Monocytes Absolute: 0.4 10*3/uL (ref 0.1–0.9)
Monocytes: 8 %
Neutrophils Absolute: 3.1 10*3/uL (ref 1.4–7.0)
Neutrophils: 56 %
Platelets: 206 10*3/uL (ref 150–450)
RBC: 4.24 x10E6/uL (ref 3.77–5.28)
RDW: 14.3 % (ref 12.3–15.4)
WBC: 5.6 10*3/uL (ref 3.4–10.8)

## 2017-11-17 LAB — CMP14+EGFR
ALT: 16 IU/L (ref 0–32)
AST: 23 IU/L (ref 0–40)
Albumin/Globulin Ratio: 2.1 (ref 1.2–2.2)
Albumin: 4.7 g/dL (ref 3.5–4.8)
Alkaline Phosphatase: 71 IU/L (ref 39–117)
BUN/Creatinine Ratio: 16 (ref 12–28)
BUN: 12 mg/dL (ref 8–27)
Bilirubin Total: 0.4 mg/dL (ref 0.0–1.2)
CO2: 23 mmol/L (ref 20–29)
Calcium: 9.7 mg/dL (ref 8.7–10.3)
Chloride: 102 mmol/L (ref 96–106)
Creatinine, Ser: 0.73 mg/dL (ref 0.57–1.00)
GFR calc Af Amer: 94 mL/min/{1.73_m2} (ref >59)
GFR calc non Af Amer: 82 mL/min/{1.73_m2} (ref >59)
Globulin, Total: 2.2 g/dL (ref 1.5–4.5)
Glucose: 82 mg/dL (ref 65–99)
Potassium: 4.1 mmol/L (ref 3.5–5.2)
Sodium: 139 mmol/L (ref 134–144)
Total Protein: 6.9 g/dL (ref 6.0–8.5)

## 2017-11-17 LAB — TSH: TSH: 1.53 u[IU]/mL (ref 0.450–4.500)

## 2017-11-17 NOTE — Progress Notes (Signed)
BP 138/85   Pulse 62   Temp (!) 97.2 F (36.2 C) (Oral)   Ht '5\' 2"'  (1.575 m)   Wt 151 lb (68.5 kg)   BMI 27.62 kg/m    Subjective:    Patient ID: Savannah Dean, female    DOB: 11/20/1945, 72 y.o.   MRN: [id number]  HPI: Savannah Dean is a 72 y.o. female presenting on 11/16/2017 for Hypertension (6 mo) and Gastroesophageal Reflux  This patient comes in for 73-monthrecheck of her chronic medical conditions.  She does have hypertension, scoliosis and osteoarthritis.  She has a very arthritis likely.  She does take medication for inflammation.  She does have GERD but it is well controlled.  She is also had some allergies this spring.  She has no other complaints at this time   History reviewed. No pertinent past medical history. Relevant past medical, surgical, family and social history reviewed and updated as indicated. Interim medical history since our last visit reviewed. Allergies and medications reviewed and updated. DATA REVIEWED: CHART IN EPIC  Family History reviewed for pertinent findings.  Review of Systems  Constitutional: Negative.   HENT: Negative.   Eyes: Negative.   Respiratory: Negative.   Gastrointestinal: Negative.   Genitourinary: Negative.   Musculoskeletal: Positive for arthralgias, back pain, gait problem, joint swelling and myalgias.    Allergies as of 11/16/2017   No Known Allergies     Medication List        Accurate as of 11/16/17 11:59 PM. Always use your most recent med list.          alendronate 70 MG tablet Commonly known as:  FOSAMAX TAKE 1 TABLET EVERY WEEK   citalopram 40 MG tablet Commonly known as:  CELEXA Take 1.5 tablets (60 mg total) by mouth daily.   levocetirizine 5 MG tablet Commonly known as:  XYZAL TAKE 1 TABLET DAILY IN THE EVENING   losartan 25 MG tablet Commonly known as:  COZAAR Take 1 tablet (25 mg total) by mouth daily.   meloxicam 7.5 MG tablet Commonly known as:  MOBIC Take 1 tablet (7.5 mg  total) by mouth daily.   omeprazole 20 MG capsule Commonly known as:  PRILOSEC TAKE ONE (1) CAPSULE EACH DAY   tiZANidine 4 MG tablet Commonly known as:  ZANAFLEX TAKE ONE TABLET EVERY 8 HOURS AS NEEDED          Objective:    BP 138/85   Pulse 62   Temp (!) 97.2 F (36.2 C) (Oral)   Ht '5\' 2"'  (1.575 m)   Wt 151 lb (68.5 kg)   BMI 27.62 kg/m   No Known Allergies  Wt Readings from Last 3 Encounters:  11/16/17 151 lb (68.5 kg)  05/14/17 155 lb 9.6 oz (70.6 kg)  11/12/16 155 lb (70.3 kg)    Physical Exam  Constitutional: She is oriented to person, place, and time. She appears well-developed and well-nourished.  HENT:  Head: Normocephalic and atraumatic.  Eyes: Pupils are equal, round, and reactive to light. Conjunctivae and EOM are normal.  Cardiovascular: Normal rate, regular rhythm, normal heart sounds and intact distal pulses.  Pulmonary/Chest: Effort normal and breath sounds normal.  Abdominal: Soft. Bowel sounds are normal.  Neurological: She is alert and oriented to person, place, and time. She has normal reflexes.  Skin: Skin is warm and dry. No rash noted.  Psychiatric: She has a normal mood and affect. Her behavior is normal. Judgment and thought content  normal.    Results for orders placed or performed in visit on 11/16/17  CMP14+EGFR  Result Value Ref Range   Glucose 82 65 - 99 mg/dL   BUN 12 8 - 27 mg/dL   Creatinine, Ser 0.73 0.57 - 1.00 mg/dL   GFR calc non Af Amer 82 >59 mL/min/1.73   GFR calc Af Amer 94 >59 mL/min/1.73   BUN/Creatinine Ratio 16 12 - 28   Sodium 139 134 - 144 mmol/L   Potassium 4.1 3.5 - 5.2 mmol/L   Chloride 102 96 - 106 mmol/L   CO2 23 20 - 29 mmol/L   Calcium 9.7 8.7 - 10.3 mg/dL   Total Protein 6.9 6.0 - 8.5 g/dL   Albumin 4.7 3.5 - 4.8 g/dL   Globulin, Total 2.2 1.5 - 4.5 g/dL   Albumin/Globulin Ratio 2.1 1.2 - 2.2   Bilirubin Total 0.4 0.0 - 1.2 mg/dL   Alkaline Phosphatase 71 39 - 117 IU/L   AST 23 0 - 40 IU/L   ALT 16  0 - 32 IU/L  CBC with Differential/Platelet  Result Value Ref Range   WBC 5.6 3.4 - 10.8 x10E3/uL   RBC 4.24 3.77 - 5.28 x10E6/uL   Hemoglobin 12.4 11.1 - 15.9 g/dL   Hematocrit 37.3 34.0 - 46.6 %   MCV 88 79 - 97 fL   MCH 29.2 26.6 - 33.0 pg   MCHC 33.2 31.5 - 35.7 g/dL   RDW 14.3 12.3 - 15.4 %   Platelets 206 150 - 450 x10E3/uL   Neutrophils 56 Not Estab. %   Lymphs 33 Not Estab. %   Monocytes 8 Not Estab. %   Eos 2 Not Estab. %   Basos 1 Not Estab. %   Neutrophils Absolute 3.1 1.4 - 7.0 x10E3/uL   Lymphocytes Absolute 1.9 0.7 - 3.1 x10E3/uL   Monocytes Absolute 0.4 0.1 - 0.9 x10E3/uL   EOS (ABSOLUTE) 0.1 0.0 - 0.4 x10E3/uL   Basophils Absolute 0.0 0.0 - 0.2 x10E3/uL   Immature Granulocytes 0 Not Estab. %   Immature Grans (Abs) 0.0 0.0 - 0.1 x10E3/uL  Lipid panel  Result Value Ref Range   Cholesterol, Total 168 100 - 199 mg/dL   Triglycerides 167 (H) 0 - 149 mg/dL   HDL 47 >39 mg/dL   VLDL Cholesterol Cal 33 5 - 40 mg/dL   LDL Calculated 88 0 - 99 mg/dL   Chol/HDL Ratio 3.6 0.0 - 4.4 ratio  TSH  Result Value Ref Range   TSH 1.530 0.450 - 4.500 uIU/mL      Assessment & Plan:   1. Essential hypertension - losartan (COZAAR) 25 MG tablet; Take 1 tablet (25 mg total) by mouth daily.  Dispense: 90 tablet; Refill: 3  2. Gastroesophageal reflux disease without esophagitis Continue nexium  3. Well adult exam - CMP14+EGFR - CBC with Differential/Platelet - Lipid panel - TSH  4. Juvenile idiopathic scoliosis of thoracolumbar region - tiZANidine (ZANAFLEX) 4 MG tablet; TAKE ONE TABLET EVERY 8 HOURS AS NEEDED  Dispense: 90 tablet; Refill: 5   Continue all other maintenance medications as listed above.  Follow up plan: Return in about 6 months (around 05/18/2018) for recheck.  Educational handout given for [place] PA-C [facility] [address]  [place], [place] [address] [phone]   11/17/2017, 10:38 PM

## 2017-11-18 DIAGNOSIS — J3089 Other allergic rhinitis: Secondary | ICD-10-CM | POA: Diagnosis not present

## 2017-11-19 ENCOUNTER — Other Ambulatory Visit: Payer: Self-pay | Admitting: Physician Assistant

## 2017-11-29 ENCOUNTER — Other Ambulatory Visit: Payer: Self-pay | Admitting: Physician Assistant

## 2017-11-29 DIAGNOSIS — M41115 Juvenile idiopathic scoliosis, thoracolumbar region: Secondary | ICD-10-CM

## 2017-11-30 DIAGNOSIS — J3089 Other allergic rhinitis: Secondary | ICD-10-CM | POA: Diagnosis not present

## 2017-12-17 DIAGNOSIS — J3089 Other allergic rhinitis: Secondary | ICD-10-CM | POA: Diagnosis not present

## 2017-12-30 DIAGNOSIS — J3089 Other allergic rhinitis: Secondary | ICD-10-CM | POA: Diagnosis not present

## 2018-01-14 DIAGNOSIS — J3089 Other allergic rhinitis: Secondary | ICD-10-CM | POA: Diagnosis not present

## 2018-01-27 DIAGNOSIS — J3089 Other allergic rhinitis: Secondary | ICD-10-CM | POA: Diagnosis not present

## 2018-02-11 DIAGNOSIS — J3089 Other allergic rhinitis: Secondary | ICD-10-CM | POA: Diagnosis not present

## 2018-02-22 ENCOUNTER — Other Ambulatory Visit: Payer: Self-pay | Admitting: Physician Assistant

## 2018-02-22 DIAGNOSIS — K219 Gastro-esophageal reflux disease without esophagitis: Secondary | ICD-10-CM

## 2018-02-24 DIAGNOSIS — J3089 Other allergic rhinitis: Secondary | ICD-10-CM | POA: Diagnosis not present

## 2018-02-28 ENCOUNTER — Other Ambulatory Visit: Payer: Self-pay | Admitting: Physician Assistant

## 2018-02-28 DIAGNOSIS — M41115 Juvenile idiopathic scoliosis, thoracolumbar region: Secondary | ICD-10-CM

## 2018-03-14 ENCOUNTER — Other Ambulatory Visit: Payer: Self-pay | Admitting: Physician Assistant

## 2018-03-14 DIAGNOSIS — J3089 Other allergic rhinitis: Secondary | ICD-10-CM | POA: Diagnosis not present

## 2018-03-14 DIAGNOSIS — F339 Major depressive disorder, recurrent, unspecified: Secondary | ICD-10-CM

## 2018-03-15 NOTE — Telephone Encounter (Signed)
Last seen 11/16/17   

## 2018-03-21 DIAGNOSIS — J3089 Other allergic rhinitis: Secondary | ICD-10-CM | POA: Diagnosis not present

## 2018-03-21 DIAGNOSIS — H1045 Other chronic allergic conjunctivitis: Secondary | ICD-10-CM | POA: Diagnosis not present

## 2018-03-21 DIAGNOSIS — R05 Cough: Secondary | ICD-10-CM | POA: Diagnosis not present

## 2018-03-21 DIAGNOSIS — R21 Rash and other nonspecific skin eruption: Secondary | ICD-10-CM | POA: Diagnosis not present

## 2018-03-31 DIAGNOSIS — J3089 Other allergic rhinitis: Secondary | ICD-10-CM | POA: Diagnosis not present

## 2018-04-11 DIAGNOSIS — J3089 Other allergic rhinitis: Secondary | ICD-10-CM | POA: Diagnosis not present

## 2018-04-18 ENCOUNTER — Other Ambulatory Visit: Payer: Self-pay | Admitting: Physician Assistant

## 2018-04-18 DIAGNOSIS — J3089 Other allergic rhinitis: Secondary | ICD-10-CM | POA: Diagnosis not present

## 2018-05-05 DIAGNOSIS — J3089 Other allergic rhinitis: Secondary | ICD-10-CM | POA: Diagnosis not present

## 2018-05-09 DIAGNOSIS — J3089 Other allergic rhinitis: Secondary | ICD-10-CM | POA: Diagnosis not present

## 2018-05-13 ENCOUNTER — Other Ambulatory Visit: Payer: Self-pay | Admitting: Physician Assistant

## 2018-05-18 ENCOUNTER — Ambulatory Visit: Payer: Medicare Other | Admitting: Physician Assistant

## 2018-05-23 DIAGNOSIS — Z0000 Encounter for general adult medical examination without abnormal findings: Secondary | ICD-10-CM | POA: Diagnosis not present

## 2018-05-26 ENCOUNTER — Other Ambulatory Visit: Payer: Self-pay | Admitting: Physician Assistant

## 2018-05-26 DIAGNOSIS — K219 Gastro-esophageal reflux disease without esophagitis: Secondary | ICD-10-CM

## 2018-05-26 DIAGNOSIS — M41115 Juvenile idiopathic scoliosis, thoracolumbar region: Secondary | ICD-10-CM

## 2018-05-26 DIAGNOSIS — J3089 Other allergic rhinitis: Secondary | ICD-10-CM | POA: Diagnosis not present

## 2018-06-09 DIAGNOSIS — J3089 Other allergic rhinitis: Secondary | ICD-10-CM | POA: Diagnosis not present

## 2018-06-14 ENCOUNTER — Ambulatory Visit: Payer: Medicare Other | Admitting: Physician Assistant

## 2018-06-14 ENCOUNTER — Encounter: Payer: Self-pay | Admitting: Physician Assistant

## 2018-06-14 VITALS — BP 133/75 | HR 61 | Temp 97.4°F | Ht 62.0 in | Wt 150.8 lb

## 2018-06-14 DIAGNOSIS — M816 Localized osteoporosis [Lequesne]: Secondary | ICD-10-CM | POA: Diagnosis not present

## 2018-06-14 DIAGNOSIS — M41115 Juvenile idiopathic scoliosis, thoracolumbar region: Secondary | ICD-10-CM

## 2018-06-14 DIAGNOSIS — K219 Gastro-esophageal reflux disease without esophagitis: Secondary | ICD-10-CM

## 2018-06-14 DIAGNOSIS — J3089 Other allergic rhinitis: Secondary | ICD-10-CM

## 2018-06-14 DIAGNOSIS — I10 Essential (primary) hypertension: Secondary | ICD-10-CM | POA: Diagnosis not present

## 2018-06-14 MED ORDER — ALENDRONATE SODIUM 70 MG PO TABS: ORAL_TABLET | ORAL | 3 refills | Status: DC

## 2018-06-14 MED ORDER — MELOXICAM 7.5 MG PO TABS: ORAL_TABLET | ORAL | 3 refills | Status: DC

## 2018-06-14 MED ORDER — LEVOCETIRIZINE DIHYDROCHLORIDE 5 MG PO TABS: 5.0000 mg | ORAL_TABLET | Freq: Every evening | ORAL | 3 refills | Status: DC

## 2018-06-14 MED ORDER — LOSARTAN POTASSIUM 25 MG PO TABS: 25.0000 mg | ORAL_TABLET | Freq: Every day | ORAL | 3 refills | Status: DC

## 2018-06-14 MED ORDER — OMEPRAZOLE 20 MG PO CPDR: 20.0000 mg | DELAYED_RELEASE_CAPSULE | Freq: Every day | ORAL | 3 refills | Status: DC

## 2018-06-14 NOTE — Patient Instructions (Signed)

## 2018-06-14 NOTE — Progress Notes (Signed)
BP 133/75   Pulse 61   Temp (!) 97.4 F (36.3 C) (Oral)   Ht '5\' 2"'  (1.575 m)   Wt 150 lb 12.8 oz (68.4 kg)   BMI 27.58 kg/m    Subjective:    Patient ID: Savannah Dean, female    DOB: [birth year], 73 y.o.   MRN: [id number]  HPI: Savannah Dean is a 73 y.o. female presenting on 06/14/2018 for Hypertension (6 month ); Medical Management of Chronic Issues; and Depression  This patient comes in for periodic recheck on medications and conditions including hypertension, scoliosis, GERD, osteoporosis, allergic rhinitis. She reports that she is doing well overall.   All medications are reviewed today. There are no reports of any problems with the medications. All of the medical conditions are reviewed and updated.  Lab work is reviewed and will be ordered as medically necessary. There are no new problems reported with today's visit.   History reviewed. No pertinent past medical history. Relevant past medical, surgical, family and social history reviewed and updated as indicated. Interim medical history since our last visit reviewed. Allergies and medications reviewed and updated. DATA REVIEWED: CHART IN EPIC  Family History reviewed for pertinent findings.  Review of Systems  Constitutional: Negative.  Negative for activity change, fatigue and fever.  HENT: Negative.   Eyes: Negative.   Respiratory: Negative.  Negative for cough.   Cardiovascular: Negative.  Negative for chest pain.  Gastrointestinal: Negative.  Negative for abdominal pain.  Endocrine: Negative.   Genitourinary: Negative.  Negative for dysuria.  Musculoskeletal: Positive for arthralgias, gait problem, joint swelling and myalgias.  Skin: Negative.     Allergies as of 06/14/2018   No Known Allergies     Medication List       Accurate as of June 14, 2018 10:50 AM. Always use your most recent med list.        alendronate 70 MG tablet Commonly known as:  FOSAMAX TAKE 1 TABLET EVERY WEEK     citalopram 40 MG tablet Commonly known as:  CELEXA TAKE 1 AND 1/2 TABLETS DAILY   levocetirizine 5 MG tablet Commonly known as:  XYZAL Take 1 tablet (5 mg total) by mouth every evening.   losartan 25 MG tablet Commonly known as:  COZAAR Take 1 tablet (25 mg total) by mouth daily.   meloxicam 7.5 MG tablet Commonly known as:  MOBIC TAKE ONE (1) TABLET EACH DAY   omeprazole 20 MG capsule Commonly known as:  PRILOSEC Take 1 capsule (20 mg total) by mouth daily.   tiZANidine 4 MG tablet Commonly known as:  ZANAFLEX TAKE ONE TABLET EVERY 8 HOURS AS NEEDED          Objective:    BP 133/75   Pulse 61   Temp (!) 97.4 F (36.3 C) (Oral)   Ht '5\' 2"'  (1.575 m)   Wt 150 lb 12.8 oz (68.4 kg)   BMI 27.58 kg/m   No Known Allergies  Wt Readings from Last 3 Encounters:  06/14/18 150 lb 12.8 oz (68.4 kg)  11/16/17 151 lb (68.5 kg)  05/14/17 155 lb 9.6 oz (70.6 kg)    Physical Exam Constitutional:      Appearance: She is well-developed.  HENT:     Head: Normocephalic and atraumatic.  Eyes:     Conjunctiva/sclera: Conjunctivae normal.     Pupils: Pupils are equal, round, and reactive to light.  Cardiovascular:     Rate and Rhythm: Normal rate and regular  rhythm.     Heart sounds: Normal heart sounds.  Pulmonary:     Effort: Pulmonary effort is normal.     Breath sounds: Normal breath sounds.  Abdominal:     General: Bowel sounds are normal.     Palpations: Abdomen is soft.  Musculoskeletal:     Thoracic back: She exhibits decreased range of motion, swelling, pain and spasm.  Skin:    General: Skin is warm and dry.     Findings: No rash.  Neurological:     Mental Status: She is alert and oriented to person, place, and time.     Deep Tendon Reflexes: Reflexes are normal and symmetric.  Psychiatric:        Behavior: Behavior normal.        Thought Content: Thought content normal.        Judgment: Judgment normal.     Results for orders placed or performed in  visit on 11/16/17  CMP14+EGFR  Result Value Ref Range   Glucose 82 65 - 99 mg/dL   BUN 12 8 - 27 mg/dL   Creatinine, Ser 0.73 0.57 - 1.00 mg/dL   GFR calc non Af Amer 82 >59 mL/min/1.73   GFR calc Af Amer 94 >59 mL/min/1.73   BUN/Creatinine Ratio 16 12 - 28   Sodium 139 134 - 144 mmol/L   Potassium 4.1 3.5 - 5.2 mmol/L   Chloride 102 96 - 106 mmol/L   CO2 23 20 - 29 mmol/L   Calcium 9.7 8.7 - 10.3 mg/dL   Total Protein 6.9 6.0 - 8.5 g/dL   Albumin 4.7 3.5 - 4.8 g/dL   Globulin, Total 2.2 1.5 - 4.5 g/dL   Albumin/Globulin Ratio 2.1 1.2 - 2.2   Bilirubin Total 0.4 0.0 - 1.2 mg/dL   Alkaline Phosphatase 71 39 - 117 IU/L   AST 23 0 - 40 IU/L   ALT 16 0 - 32 IU/L  CBC with Differential/Platelet  Result Value Ref Range   WBC 5.6 3.4 - 10.8 x10E3/uL   RBC 4.24 3.77 - 5.28 x10E6/uL   Hemoglobin 12.4 11.1 - 15.9 g/dL   Hematocrit 37.3 34.0 - 46.6 %   MCV 88 79 - 97 fL   MCH 29.2 26.6 - 33.0 pg   MCHC 33.2 31.5 - 35.7 g/dL   RDW 14.3 12.3 - 15.4 %   Platelets 206 150 - 450 x10E3/uL   Neutrophils 56 Not Estab. %   Lymphs 33 Not Estab. %   Monocytes 8 Not Estab. %   Eos 2 Not Estab. %   Basos 1 Not Estab. %   Neutrophils Absolute 3.1 1.4 - 7.0 x10E3/uL   Lymphocytes Absolute 1.9 0.7 - 3.1 x10E3/uL   Monocytes Absolute 0.4 0.1 - 0.9 x10E3/uL   EOS (ABSOLUTE) 0.1 0.0 - 0.4 x10E3/uL   Basophils Absolute 0.0 0.0 - 0.2 x10E3/uL   Immature Granulocytes 0 Not Estab. %   Immature Grans (Abs) 0.0 0.0 - 0.1 x10E3/uL  Lipid panel  Result Value Ref Range   Cholesterol, Total 168 100 - 199 mg/dL   Triglycerides 167 (H) 0 - 149 mg/dL   HDL 47 >39 mg/dL   VLDL Cholesterol Cal 33 5 - 40 mg/dL   LDL Calculated 88 0 - 99 mg/dL   Chol/HDL Ratio 3.6 0.0 - 4.4 ratio  TSH  Result Value Ref Range   TSH 1.530 0.450 - 4.500 uIU/mL      Assessment & Plan:   1. Essential hypertension - losartan (  COZAAR) 25 MG tablet; Take 1 tablet (25 mg total) by mouth daily.  Dispense: 90 tablet; Refill:  3  2. Juvenile idiopathic scoliosis of thoracolumbar region - meloxicam (MOBIC) 7.5 MG tablet; TAKE ONE (1) TABLET EACH DAY  Dispense: 90 tablet; Refill: 3  3. Gastroesophageal reflux disease without esophagitis - omeprazole (PRILOSEC) 20 MG capsule; Take 1 capsule (20 mg total) by mouth daily.  Dispense: 90 capsule; Refill: 3  4. Localized osteoporosis without current pathological fracture - alendronate (FOSAMAX) 70 MG tablet; TAKE 1 TABLET EVERY WEEK  Dispense: 12 tablet; Refill: 3  5. Non-seasonal allergic rhinitis, unspecified trigger - levocetirizine (XYZAL) 5 MG tablet; Take 1 tablet (5 mg total) by mouth every evening.  Dispense: 90 tablet; Refill: 3   Continue all other maintenance medications as listed above.  Follow up plan: Return in about 6 months (around 12/13/2018) for recheck and labs.  Educational handout given for [place] PA-C [facility] [address]  [place], [place] [address] [phone]   06/14/2018, 10:50 AM

## 2018-06-27 DIAGNOSIS — J3089 Other allergic rhinitis: Secondary | ICD-10-CM | POA: Diagnosis not present

## 2018-07-04 DIAGNOSIS — J3089 Other allergic rhinitis: Secondary | ICD-10-CM | POA: Diagnosis not present

## 2018-07-11 ENCOUNTER — Other Ambulatory Visit: Payer: Self-pay | Admitting: Physician Assistant

## 2018-07-11 DIAGNOSIS — J3089 Other allergic rhinitis: Secondary | ICD-10-CM | POA: Diagnosis not present

## 2018-07-21 DIAGNOSIS — J3089 Other allergic rhinitis: Secondary | ICD-10-CM | POA: Diagnosis not present

## 2018-08-08 DIAGNOSIS — J3089 Other allergic rhinitis: Secondary | ICD-10-CM | POA: Diagnosis not present

## 2018-08-15 DIAGNOSIS — J3089 Other allergic rhinitis: Secondary | ICD-10-CM | POA: Diagnosis not present

## 2018-08-15 DIAGNOSIS — J301 Allergic rhinitis due to pollen: Secondary | ICD-10-CM | POA: Diagnosis not present

## 2018-08-24 DIAGNOSIS — J3089 Other allergic rhinitis: Secondary | ICD-10-CM | POA: Diagnosis not present

## 2018-08-31 DIAGNOSIS — J3089 Other allergic rhinitis: Secondary | ICD-10-CM | POA: Diagnosis not present

## 2018-09-06 DIAGNOSIS — J3089 Other allergic rhinitis: Secondary | ICD-10-CM | POA: Diagnosis not present

## 2018-09-14 DIAGNOSIS — J3089 Other allergic rhinitis: Secondary | ICD-10-CM | POA: Diagnosis not present

## 2018-09-26 DIAGNOSIS — J3089 Other allergic rhinitis: Secondary | ICD-10-CM | POA: Diagnosis not present

## 2018-10-04 DIAGNOSIS — J3089 Other allergic rhinitis: Secondary | ICD-10-CM | POA: Diagnosis not present

## 2018-10-11 DIAGNOSIS — J3089 Other allergic rhinitis: Secondary | ICD-10-CM | POA: Diagnosis not present

## 2018-10-18 ENCOUNTER — Telehealth: Payer: Self-pay | Admitting: Physician Assistant

## 2018-10-26 ENCOUNTER — Other Ambulatory Visit: Payer: Self-pay | Admitting: Physician Assistant

## 2018-10-26 DIAGNOSIS — J3089 Other allergic rhinitis: Secondary | ICD-10-CM | POA: Diagnosis not present

## 2018-11-02 DIAGNOSIS — J3089 Other allergic rhinitis: Secondary | ICD-10-CM | POA: Diagnosis not present

## 2018-11-10 DIAGNOSIS — J3089 Other allergic rhinitis: Secondary | ICD-10-CM | POA: Diagnosis not present

## 2018-11-23 DIAGNOSIS — J3089 Other allergic rhinitis: Secondary | ICD-10-CM | POA: Diagnosis not present

## 2018-12-08 DIAGNOSIS — J3089 Other allergic rhinitis: Secondary | ICD-10-CM | POA: Diagnosis not present

## 2018-12-14 ENCOUNTER — Other Ambulatory Visit: Payer: Self-pay | Admitting: Physician Assistant

## 2018-12-14 DIAGNOSIS — F339 Major depressive disorder, recurrent, unspecified: Secondary | ICD-10-CM

## 2018-12-14 NOTE — Telephone Encounter (Signed)
Last seen 06/14/2018 

## 2018-12-27 DIAGNOSIS — J3089 Other allergic rhinitis: Secondary | ICD-10-CM | POA: Diagnosis not present

## 2018-12-30 DIAGNOSIS — Z029 Encounter for administrative examinations, unspecified: Secondary | ICD-10-CM

## 2019-01-04 ENCOUNTER — Other Ambulatory Visit: Payer: Self-pay | Admitting: Physician Assistant

## 2019-01-04 DIAGNOSIS — M41115 Juvenile idiopathic scoliosis, thoracolumbar region: Secondary | ICD-10-CM

## 2019-01-10 ENCOUNTER — Telehealth: Payer: Self-pay | Admitting: Physician Assistant

## 2019-01-10 ENCOUNTER — Other Ambulatory Visit: Payer: Self-pay | Admitting: Physician Assistant

## 2019-01-10 MED ORDER — DICLOFENAC SODIUM 1 % TD GEL: 4.0000 g | Freq: Four times a day (QID) | TRANSDERMAL | 2 refills | Status: AC

## 2019-01-10 NOTE — Telephone Encounter (Signed)
She is on mobic?

## 2019-01-10 NOTE — Telephone Encounter (Signed)
Sent rx.

## 2019-01-10 NOTE — Telephone Encounter (Signed)
Patient has been using OTC Voltaren along with meloxicam. Patient states that voltaren is on back order and will need a Rx of generic voltaren gel

## 2019-01-10 NOTE — Progress Notes (Signed)
voltaren gel

## 2019-01-11 NOTE — Telephone Encounter (Signed)
Left message, script was sent to pharmacy.

## 2019-01-12 DIAGNOSIS — J3089 Other allergic rhinitis: Secondary | ICD-10-CM | POA: Diagnosis not present

## 2019-01-24 ENCOUNTER — Other Ambulatory Visit: Payer: Self-pay | Admitting: Physician Assistant

## 2019-01-24 DIAGNOSIS — L719 Rosacea, unspecified: Secondary | ICD-10-CM

## 2019-01-26 DIAGNOSIS — J3089 Other allergic rhinitis: Secondary | ICD-10-CM | POA: Diagnosis not present

## 2019-02-08 DIAGNOSIS — J3089 Other allergic rhinitis: Secondary | ICD-10-CM | POA: Diagnosis not present

## 2019-02-20 ENCOUNTER — Other Ambulatory Visit: Payer: Self-pay | Admitting: Physician Assistant

## 2019-02-20 DIAGNOSIS — J3089 Other allergic rhinitis: Secondary | ICD-10-CM | POA: Diagnosis not present

## 2019-02-21 DIAGNOSIS — J3089 Other allergic rhinitis: Secondary | ICD-10-CM | POA: Diagnosis not present

## 2019-03-09 DIAGNOSIS — J3089 Other allergic rhinitis: Secondary | ICD-10-CM | POA: Diagnosis not present

## 2019-03-24 DIAGNOSIS — J3089 Other allergic rhinitis: Secondary | ICD-10-CM | POA: Diagnosis not present

## 2019-04-06 DIAGNOSIS — J3089 Other allergic rhinitis: Secondary | ICD-10-CM | POA: Diagnosis not present

## 2019-04-17 DIAGNOSIS — J3089 Other allergic rhinitis: Secondary | ICD-10-CM | POA: Diagnosis not present

## 2019-04-24 DIAGNOSIS — J3089 Other allergic rhinitis: Secondary | ICD-10-CM | POA: Diagnosis not present

## 2019-05-04 DIAGNOSIS — J3089 Other allergic rhinitis: Secondary | ICD-10-CM | POA: Diagnosis not present

## 2019-05-09 ENCOUNTER — Telehealth: Payer: Self-pay | Admitting: Physician Assistant

## 2019-05-10 ENCOUNTER — Other Ambulatory Visit: Payer: Self-pay | Admitting: Physician Assistant

## 2019-05-10 DIAGNOSIS — M41115 Juvenile idiopathic scoliosis, thoracolumbar region: Secondary | ICD-10-CM

## 2019-05-10 MED ORDER — MELOXICAM 15 MG PO TABS: ORAL_TABLET | ORAL | 1 refills | Status: DC

## 2019-05-10 NOTE — Telephone Encounter (Signed)
We will do it for one month, then come in for labs to make sure it is not bothering her kidneys.  Order for labs will be placed and mobic 15 mg sent to the pharmacy

## 2019-05-11 DIAGNOSIS — J3089 Other allergic rhinitis: Secondary | ICD-10-CM | POA: Diagnosis not present

## 2019-05-12 ENCOUNTER — Other Ambulatory Visit: Payer: Self-pay

## 2019-05-12 ENCOUNTER — Ambulatory Visit (INDEPENDENT_AMBULATORY_CARE_PROVIDER_SITE_OTHER): Payer: Medicare Other | Admitting: Family

## 2019-05-12 ENCOUNTER — Encounter: Payer: Self-pay | Admitting: Family

## 2019-05-12 DIAGNOSIS — R200 Anesthesia of skin: Secondary | ICD-10-CM

## 2019-05-12 DIAGNOSIS — R29898 Other symptoms and signs involving the musculoskeletal system: Secondary | ICD-10-CM

## 2019-05-12 MED ORDER — GABAPENTIN 100 MG PO CAPS: 100.0000 mg | ORAL_CAPSULE | Freq: Two times a day (BID) | ORAL | 3 refills | Status: DC | PRN

## 2019-05-12 NOTE — Progress Notes (Signed)
   Virtual Visit via telephone Note Due to COVID-19 pandemic this visit was conducted virtually. This visit type was conducted due to national recommendations for restrictions regarding the COVID-19 Pandemic (e.g. social distancing, sheltering in place) in an effort to limit this patient's exposure and mitigate transmission in our community. All issues noted in this document were discussed and addressed.  A physical exam was not performed with this format.  I connected with Savannah Dean on 05/12/19 at 3:08 pm by telephone and verified that I am speaking with the correct person using two identifiers. Savannah Dean is currently located at home and no one is currently with her during visit. The provider, Evelina Dun, FNP is located in their office at time of visit.  I discussed the limitations, risks, security and privacy concerns of performing an evaluation and management service by telephone and the availability of in person appointments. I also discussed with the patient that there may be a patient responsible charge related to this service. The patient expressed understanding and agreed to proceed.   History and Present Illness:  HPI  PT calls the office today with complaints of numbness and tingling for the last two months.   She states she has noticed weakness and pain in her right foot and toes yesterday. She states it has been hard to walk. Denies any pain.   She reports she has scoliosis and leg length discrepancy and has chronic arthritis and hip pain. However, these are not bothering her today.   Denies any headaches, vision changes, changes in memory or speech.   Review of Systems  All other systems reviewed and are negative.    Observations/Objective: No SOB or distress noted   Assessment and Plan: 1. Weakness of right foot - Ambulatory referral to Neurology - Ambulatory referral to Physical Therapy  2. Numbness in feet - Ambulatory referral to Neurology -  Ambulatory referral to Physical Therapy - gabapentin (NEURONTIN) 100 MG capsule; Take 1 capsule (100 mg total) by mouth 2 (two) times daily as needed.  Dispense: 60 capsule; Refill: 3  Will do referral to Neurologists If any changes in speech, vision, or increased gait go to ED!! Pt does not think this is a stroke and does not want a MRI at this time. 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  Will given gabapentin to help with numbness. Sedation precautions discussed.    I discussed the assessment and treatment plan with the patient. The patient was provided an opportunity to ask questions and all were answered. The patient agreed with the plan and demonstrated an understanding of the instructions.   The patient was advised to call back or seek an in-person evaluation if the symptoms worsen or if the condition fails to improve as anticipated.  The above assessment and management plan was discussed with the patient. The patient verbalized understanding of and has agreed to the management plan. Patient is aware to call the clinic if symptoms persist or worsen. Patient is aware when to return to the clinic for a follow-up visit. Patient educated on when it is appropriate to go to the emergency department.   Time call ended:  3:35 pm   I provided 27 minutes of non-face-to-face time during this encounter.    Evelina Dun, FNP

## 2019-05-12 NOTE — Telephone Encounter (Signed)
Patient had an apt today - this encounter will be closed.

## 2019-05-21 ENCOUNTER — Encounter: Payer: Self-pay | Admitting: Family Medicine

## 2019-05-21 LAB — IFOBT (OCCULT BLOOD): IFOBT: POSITIVE

## 2019-05-31 DIAGNOSIS — J3089 Other allergic rhinitis: Secondary | ICD-10-CM | POA: Diagnosis not present

## 2019-06-07 ENCOUNTER — Other Ambulatory Visit: Payer: Self-pay | Admitting: Physician Assistant

## 2019-06-07 DIAGNOSIS — M816 Localized osteoporosis [Lequesne]: Secondary | ICD-10-CM

## 2019-06-07 DIAGNOSIS — J3089 Other allergic rhinitis: Secondary | ICD-10-CM | POA: Diagnosis not present

## 2019-06-07 NOTE — Telephone Encounter (Signed)
OV 06/29/19

## 2019-06-12 ENCOUNTER — Telehealth: Payer: Self-pay | Admitting: *Deleted

## 2019-06-12 ENCOUNTER — Other Ambulatory Visit: Payer: Self-pay | Admitting: Physician Assistant

## 2019-06-12 DIAGNOSIS — M41115 Juvenile idiopathic scoliosis, thoracolumbar region: Secondary | ICD-10-CM

## 2019-06-12 NOTE — Telephone Encounter (Signed)
I have called and left patient a message to call to review FOBT report at my desk. FOBT positive and patient needs referral to GI.

## 2019-06-21 DIAGNOSIS — J3089 Other allergic rhinitis: Secondary | ICD-10-CM | POA: Diagnosis not present

## 2019-06-27 DIAGNOSIS — J3089 Other allergic rhinitis: Secondary | ICD-10-CM | POA: Diagnosis not present

## 2019-06-28 ENCOUNTER — Other Ambulatory Visit: Payer: Self-pay

## 2019-06-29 ENCOUNTER — Encounter: Payer: Self-pay | Admitting: Physician Assistant

## 2019-06-29 ENCOUNTER — Ambulatory Visit (INDEPENDENT_AMBULATORY_CARE_PROVIDER_SITE_OTHER): Payer: Medicare Other | Admitting: Physician Assistant

## 2019-06-29 VITALS — BP 148/78 | HR 69 | Temp 97.0°F | Ht 62.0 in | Wt 157.8 lb

## 2019-06-29 DIAGNOSIS — Z0000 Encounter for general adult medical examination without abnormal findings: Secondary | ICD-10-CM

## 2019-06-29 DIAGNOSIS — Z1211 Encounter for screening for malignant neoplasm of colon: Secondary | ICD-10-CM | POA: Diagnosis not present

## 2019-06-29 DIAGNOSIS — G629 Polyneuropathy, unspecified: Secondary | ICD-10-CM | POA: Diagnosis not present

## 2019-06-29 DIAGNOSIS — Z8719 Personal history of other diseases of the digestive system: Secondary | ICD-10-CM

## 2019-06-29 DIAGNOSIS — R195 Other fecal abnormalities: Secondary | ICD-10-CM | POA: Diagnosis not present

## 2019-06-29 NOTE — Progress Notes (Signed)
Acute Office Visit  Subjective:    Patient ID: Savannah Dean, female    DOB: 21-May-1946, 74 y.o.   MRN: [id number]  Chief Complaint  Patient presents with  . FOBT    Discuss positive FOBT     Arthritis Presents for follow-up visit. She complains of pain, stiffness and joint swelling. Affected locations include the right hip, left hip, left knee and right knee. Her pain is at a severity of 5/10. Compliance with medications is 76-100%.  Hypertension This is a chronic problem. The current episode started more than 1 year ago. The problem is controlled. Past treatments include angiotensin blockers. The current treatment provides significant improvement.  Gastroesophageal Reflux She complains of abdominal pain and heartburn. This is a chronic problem. The current episode started more than 1 year ago. The problem occurs rarely. Risk factors include NSAIDs. She has tried a PPI for the symptoms. The treatment provided significant relief.   The patient also had a Cologuard performed.  The test result on this was positive.  I have explained to her that the test screens for DNA of polyps and cancer.  Therefore she does need to go to a gastroenterologist for evaluation.  She states that she did have a brother in his 5s with polyps.  But no one else has had colon cancer.  Past Medical History:  Diagnosis Date  . Depression   . Hypertension     Past Surgical History:  Procedure Laterality Date  . SPINAL FUSION  1950    Family History  Problem Relation Age of Onset  . Hypertension Mother   . Hypertension Father   . Diabetes Brother   . Diabetes Maternal Grandfather     Social History   Socioeconomic History  . Marital status: Divorced    Spouse name: Not on file  . Number of children: Not on file  . Years of education: Not on file  . Highest education level: Not on file  Occupational History  . Not on file  Tobacco Use  . Smoking status: Never Smoker  . Smokeless tobacco:  Never Used  Substance and Sexual Activity  . Alcohol use: Never  . Drug use: Never  . Sexual activity: Not on file  Other Topics Concern  . Not on file  Social History Narrative  . Not on file   Social Determinants of Health   Financial Resource Strain:   . Difficulty of Paying Living Expenses: Not on file  Food Insecurity:   . Worried About Charity fundraiser in the Last Year: Not on file  . Ran Out of Food in the Last Year: Not on file  Transportation Needs:   . Lack of Transportation (Medical): Not on file  . Lack of Transportation (Non-Medical): Not on file  Physical Activity:   . Days of Exercise per Week: Not on file  . Minutes of Exercise per Session: Not on file  Stress:   . Feeling of Stress : Not on file  Social Connections:   . Frequency of Communication with Friends and Family: Not on file  . Frequency of Social Gatherings with Friends and Family: Not on file  . Attends Religious Services: Not on file  . Active Member of Clubs or Organizations: Not on file  . Attends Archivist Meetings: Not on file  . Marital Status: Not on file  Intimate Partner Violence:   . Fear of Current or Ex-Partner: Not on file  . Emotionally Abused: Not on  file  . Physically Abused: Not on file  . Sexually Abused: Not on file    Outpatient Medications Prior to Visit  Medication Sig Dispense Refill  . alendronate (FOSAMAX) 70 MG tablet TAKE 1 TABLET EVERY WEEK 12 tablet 0  . citalopram (CELEXA) 40 MG tablet TAKE 1 AND 1/2 TABLETS DAILY 135 tablet 3  . diclofenac sodium (VOLTAREN) 1 % GEL Apply 4 g topically 4 (four) times daily. 400 g 2  . gabapentin (NEURONTIN) 100 MG capsule Take 1 capsule (100 mg total) by mouth 2 (two) times daily as needed. 60 capsule 3  . ketoconazole (NIZORAL) 2 % cream APPLY TWICE DAILY 60 g 0  . levocetirizine (XYZAL) 5 MG tablet Take 1 tablet (5 mg total) by mouth every evening. 90 tablet 3  . losartan (COZAAR) 25 MG tablet Take 1 tablet (25 mg  total) by mouth daily. 90 tablet 3  . meloxicam (MOBIC) 15 MG tablet TAKE ONE (1) TABLET EACH DAY 30 tablet 0  . metroNIDAZOLE (METROCREAM) 0.75 % cream APPLY TWICE DAILY 45 g 11  . neomycin-polymyxin b-dexamethasone (MAXITROL) 3.5-10000-0.1 OINT APPLY THREE TIMES A DAY AS DIRECTED 3.5 g 0  . omeprazole (PRILOSEC) 20 MG capsule Take 1 capsule (20 mg total) by mouth daily. 90 capsule 3  . tiZANidine (ZANAFLEX) 4 MG tablet TAKE ONE TABLET EVERY 8 HOURS AS NEEDED 90 tablet 5   No facility-administered medications prior to visit.    No Known Allergies  Review of Systems  Constitutional: Negative.   HENT: Negative.   Eyes: Negative.   Respiratory: Negative.   Gastrointestinal: Positive for abdominal pain and heartburn.  Genitourinary: Negative.   Musculoskeletal: Positive for arthralgias, arthritis, back pain, gait problem, joint swelling and stiffness.       Objective:    Physical Exam Constitutional:      General: She is not in acute distress.    Appearance: Normal appearance. She is well-developed.  HENT:     Head: Normocephalic and atraumatic.  Cardiovascular:     Rate and Rhythm: Normal rate.  Pulmonary:     Effort: Pulmonary effort is normal.  Skin:    General: Skin is warm and dry.     Findings: No rash.  Neurological:     Mental Status: She is alert and oriented to person, place, and time.     Deep Tendon Reflexes: Reflexes are normal and symmetric.     BP (!) 148/78   Pulse 69   Temp (!) 97 F (36.1 C) (Temporal)   Ht 5' 2" (1.575 m)   Wt 157 lb 12.8 oz (71.6 kg)   SpO2 99%   BMI 28.86 kg/m  Wt Readings from Last 3 Encounters:  06/29/19 157 lb 12.8 oz (71.6 kg)  06/14/18 150 lb 12.8 oz (68.4 kg)  11/16/17 151 lb (68.5 kg)    Health Maintenance Due  Topic Date Due  . Hepatitis C Screening  1945-10-16  . TETANUS/TDAP  01/14/1963  . COLONOSCOPY  01/13/1994  . DEXA SCAN  01/13/2009    There are no preventive care reminders to display for this  patient.   Lab Results  Component Value Date   TSH 1.530 11/16/2017   Lab Results  Component Value Date   WBC 5.6 11/16/2017   HGB 12.4 11/16/2017   HCT 37.3 11/16/2017   MCV 88 11/16/2017   PLT 206 11/16/2017   Lab Results  Component Value Date   NA 139 11/16/2017   K 4.1 11/16/2017  CO2 23 11/16/2017   GLUCOSE 82 11/16/2017   BUN 12 11/16/2017   CREATININE 0.73 11/16/2017   BILITOT 0.4 11/16/2017   ALKPHOS 71 11/16/2017   AST 23 11/16/2017   ALT 16 11/16/2017   PROT 6.9 11/16/2017   ALBUMIN 4.7 11/16/2017   CALCIUM 9.7 11/16/2017   Lab Results  Component Value Date   CHOL 168 11/16/2017   Lab Results  Component Value Date   HDL 47 11/16/2017   Lab Results  Component Value Date   LDLCALC 88 11/16/2017   Lab Results  Component Value Date   TRIG 167 (H) 11/16/2017   Lab Results  Component Value Date   CHOLHDL 3.6 11/16/2017   No results found for: HGBA1C     Assessment & Plan:   Problem List Items Addressed This Visit    None    Visit Diagnoses    Well adult exam    -  Primary   Relevant Orders   CBC with Differential/Platelet   CMP14+EGFR   Lipid panel   TSH   Colon cancer screening       Relevant Orders   Ambulatory referral to General Surgery       No orders of the defined types were placed in this encounter.    Terald Sleeper, PA-C

## 2019-06-30 LAB — CMP14+EGFR
ALT: 15 IU/L (ref 0–32)
AST: 21 IU/L (ref 0–40)
Albumin/Globulin Ratio: 1.8 (ref 1.2–2.2)
Albumin: 4.6 g/dL (ref 3.7–4.7)
Alkaline Phosphatase: 90 IU/L (ref 39–117)
BUN/Creatinine Ratio: 19 (ref 12–28)
BUN: 19 mg/dL (ref 8–27)
Bilirubin Total: 0.4 mg/dL (ref 0.0–1.2)
CO2: 24 mmol/L (ref 20–29)
Calcium: 9.6 mg/dL (ref 8.7–10.3)
Chloride: 101 mmol/L (ref 96–106)
Creatinine, Ser: 0.99 mg/dL (ref 0.57–1.00)
GFR calc Af Amer: 64 mL/min/{1.73_m2} (ref >59)
GFR calc non Af Amer: 56 mL/min/{1.73_m2} — ABNORMAL LOW (ref >59)
Globulin, Total: 2.5 g/dL (ref 1.5–4.5)
Glucose: 84 mg/dL (ref 65–99)
Potassium: 4.5 mmol/L (ref 3.5–5.2)
Sodium: 139 mmol/L (ref 134–144)
Total Protein: 7.1 g/dL (ref 6.0–8.5)

## 2019-06-30 LAB — CBC WITH DIFFERENTIAL/PLATELET
Basophils Absolute: 0.1 10*3/uL (ref 0.0–0.2)
Basos: 1 %
EOS (ABSOLUTE): 0.2 10*3/uL (ref 0.0–0.4)
Eos: 3 %
Hematocrit: 38.4 % (ref 34.0–46.6)
Hemoglobin: 12.7 g/dL (ref 11.1–15.9)
Immature Grans (Abs): 0 10*3/uL (ref 0.0–0.1)
Immature Granulocytes: 0 %
Lymphocytes Absolute: 2.2 10*3/uL (ref 0.7–3.1)
Lymphs: 37 %
MCH: 29.1 pg (ref 26.6–33.0)
MCHC: 33.1 g/dL (ref 31.5–35.7)
MCV: 88 fL (ref 79–97)
Monocytes Absolute: 0.5 10*3/uL (ref 0.1–0.9)
Monocytes: 9 %
Neutrophils Absolute: 2.9 10*3/uL (ref 1.4–7.0)
Neutrophils: 50 %
Platelets: 223 10*3/uL (ref 150–450)
RBC: 4.37 x10E6/uL (ref 3.77–5.28)
RDW: 13.2 % (ref 11.7–15.4)
WBC: 5.9 10*3/uL (ref 3.4–10.8)

## 2019-06-30 LAB — LIPID PANEL
Chol/HDL Ratio: 3.3 ratio (ref 0.0–4.4)
Cholesterol, Total: 175 mg/dL (ref 100–199)
HDL: 53 mg/dL (ref >39)
LDL Chol Calc (NIH): 96 mg/dL (ref 0–99)
Triglycerides: 147 mg/dL (ref 0–149)
VLDL Cholesterol Cal: 26 mg/dL (ref 5–40)

## 2019-06-30 LAB — TSH: TSH: 4.46 u[IU]/mL (ref 0.450–4.500)

## 2019-07-03 ENCOUNTER — Encounter: Payer: Self-pay | Admitting: Physician Assistant

## 2019-07-11 ENCOUNTER — Other Ambulatory Visit: Payer: Self-pay | Admitting: Physician Assistant

## 2019-07-11 DIAGNOSIS — M41115 Juvenile idiopathic scoliosis, thoracolumbar region: Secondary | ICD-10-CM

## 2019-07-18 DIAGNOSIS — J3089 Other allergic rhinitis: Secondary | ICD-10-CM | POA: Diagnosis not present

## 2019-07-26 ENCOUNTER — Ambulatory Visit: Payer: Medicare Other

## 2019-08-02 DIAGNOSIS — J3089 Other allergic rhinitis: Secondary | ICD-10-CM | POA: Diagnosis not present

## 2019-08-11 ENCOUNTER — Other Ambulatory Visit: Payer: Self-pay | Admitting: Physician Assistant

## 2019-08-11 ENCOUNTER — Other Ambulatory Visit: Payer: Self-pay | Admitting: Family

## 2019-08-11 DIAGNOSIS — K219 Gastro-esophageal reflux disease without esophagitis: Secondary | ICD-10-CM

## 2019-08-11 DIAGNOSIS — M41115 Juvenile idiopathic scoliosis, thoracolumbar region: Secondary | ICD-10-CM

## 2019-08-11 DIAGNOSIS — R200 Anesthesia of skin: Secondary | ICD-10-CM

## 2019-08-11 MED ORDER — OMEPRAZOLE 20 MG PO CPDR: 20.0000 mg | DELAYED_RELEASE_CAPSULE | Freq: Every day | ORAL | 3 refills | Status: DC

## 2019-08-16 DIAGNOSIS — H1045 Other chronic allergic conjunctivitis: Secondary | ICD-10-CM | POA: Diagnosis not present

## 2019-08-16 DIAGNOSIS — J3089 Other allergic rhinitis: Secondary | ICD-10-CM | POA: Diagnosis not present

## 2019-08-16 DIAGNOSIS — R05 Cough: Secondary | ICD-10-CM | POA: Diagnosis not present

## 2019-08-16 DIAGNOSIS — R21 Rash and other nonspecific skin eruption: Secondary | ICD-10-CM | POA: Diagnosis not present

## 2019-08-28 DIAGNOSIS — J3089 Other allergic rhinitis: Secondary | ICD-10-CM | POA: Diagnosis not present

## 2019-08-31 ENCOUNTER — Other Ambulatory Visit: Payer: Self-pay | Admitting: Family Medicine

## 2019-08-31 DIAGNOSIS — M816 Localized osteoporosis [Lequesne]: Secondary | ICD-10-CM

## 2019-08-31 MED ORDER — ALENDRONATE SODIUM 70 MG PO TABS: ORAL_TABLET | ORAL | 0 refills | Status: DC

## 2019-08-31 NOTE — Telephone Encounter (Signed)
I don't mind approving but I don't see any DEXA scans/ diagnosis of osteoporosis.  If she has not had a DEXA in the last 2 years, I'd like to get this.  Please make sure she has a f/u appt to est care with someone as well.

## 2019-09-04 DIAGNOSIS — J3089 Other allergic rhinitis: Secondary | ICD-10-CM | POA: Diagnosis not present

## 2019-09-07 ENCOUNTER — Other Ambulatory Visit: Payer: Self-pay | Admitting: *Deleted

## 2019-09-07 DIAGNOSIS — M41115 Juvenile idiopathic scoliosis, thoracolumbar region: Secondary | ICD-10-CM

## 2019-09-07 MED ORDER — TIZANIDINE HCL 4 MG PO TABS: 4.0000 mg | ORAL_TABLET | Freq: Three times a day (TID) | ORAL | 0 refills | Status: DC | PRN

## 2019-09-11 ENCOUNTER — Other Ambulatory Visit: Payer: Self-pay | Admitting: *Deleted

## 2019-09-11 DIAGNOSIS — I10 Essential (primary) hypertension: Secondary | ICD-10-CM

## 2019-09-11 MED ORDER — LOSARTAN POTASSIUM 25 MG PO TABS: 25.0000 mg | ORAL_TABLET | Freq: Every day | ORAL | 0 refills | Status: DC

## 2019-09-13 DIAGNOSIS — Z1211 Encounter for screening for malignant neoplasm of colon: Secondary | ICD-10-CM | POA: Diagnosis not present

## 2019-09-15 DIAGNOSIS — J3089 Other allergic rhinitis: Secondary | ICD-10-CM | POA: Diagnosis not present

## 2019-09-18 DIAGNOSIS — D126 Benign neoplasm of colon, unspecified: Secondary | ICD-10-CM | POA: Insufficient documentation

## 2019-09-25 DIAGNOSIS — J3089 Other allergic rhinitis: Secondary | ICD-10-CM | POA: Diagnosis not present

## 2019-10-03 DIAGNOSIS — Z01818 Encounter for other preprocedural examination: Secondary | ICD-10-CM | POA: Diagnosis not present

## 2019-10-05 DIAGNOSIS — I10 Essential (primary) hypertension: Secondary | ICD-10-CM | POA: Diagnosis not present

## 2019-10-05 DIAGNOSIS — Z79899 Other long term (current) drug therapy: Secondary | ICD-10-CM | POA: Diagnosis not present

## 2019-10-05 DIAGNOSIS — M419 Scoliosis, unspecified: Secondary | ICD-10-CM | POA: Diagnosis not present

## 2019-10-05 DIAGNOSIS — Q438 Other specified congenital malformations of intestine: Secondary | ICD-10-CM | POA: Diagnosis not present

## 2019-10-05 DIAGNOSIS — D122 Benign neoplasm of ascending colon: Secondary | ICD-10-CM | POA: Diagnosis not present

## 2019-10-05 DIAGNOSIS — Z1211 Encounter for screening for malignant neoplasm of colon: Secondary | ICD-10-CM | POA: Diagnosis not present

## 2019-10-05 DIAGNOSIS — F419 Anxiety disorder, unspecified: Secondary | ICD-10-CM | POA: Diagnosis not present

## 2019-10-05 DIAGNOSIS — K219 Gastro-esophageal reflux disease without esophagitis: Secondary | ICD-10-CM | POA: Diagnosis not present

## 2019-10-05 DIAGNOSIS — K644 Residual hemorrhoidal skin tags: Secondary | ICD-10-CM | POA: Diagnosis not present

## 2019-10-05 DIAGNOSIS — K642 Third degree hemorrhoids: Secondary | ICD-10-CM | POA: Diagnosis not present

## 2019-10-05 DIAGNOSIS — Z791 Long term (current) use of non-steroidal anti-inflammatories (NSAID): Secondary | ICD-10-CM | POA: Diagnosis not present

## 2019-10-05 DIAGNOSIS — G629 Polyneuropathy, unspecified: Secondary | ICD-10-CM | POA: Diagnosis not present

## 2019-10-05 DIAGNOSIS — K635 Polyp of colon: Secondary | ICD-10-CM | POA: Diagnosis not present

## 2019-10-10 ENCOUNTER — Other Ambulatory Visit: Payer: Self-pay | Admitting: Family Medicine

## 2019-10-10 DIAGNOSIS — I10 Essential (primary) hypertension: Secondary | ICD-10-CM

## 2019-10-13 DIAGNOSIS — J3089 Other allergic rhinitis: Secondary | ICD-10-CM | POA: Diagnosis not present

## 2019-10-20 DIAGNOSIS — J3089 Other allergic rhinitis: Secondary | ICD-10-CM | POA: Diagnosis not present

## 2019-11-03 DIAGNOSIS — J3089 Other allergic rhinitis: Secondary | ICD-10-CM | POA: Diagnosis not present

## 2019-11-07 ENCOUNTER — Other Ambulatory Visit: Payer: Self-pay | Admitting: *Deleted

## 2019-11-07 DIAGNOSIS — R200 Anesthesia of skin: Secondary | ICD-10-CM

## 2019-11-07 MED ORDER — GABAPENTIN 100 MG PO CAPS: 100.0000 mg | ORAL_CAPSULE | Freq: Two times a day (BID) | ORAL | 0 refills | Status: DC | PRN

## 2019-11-10 ENCOUNTER — Other Ambulatory Visit: Payer: Self-pay | Admitting: Family Medicine

## 2019-11-10 DIAGNOSIS — I10 Essential (primary) hypertension: Secondary | ICD-10-CM

## 2019-11-13 DIAGNOSIS — D126 Benign neoplasm of colon, unspecified: Secondary | ICD-10-CM | POA: Diagnosis not present

## 2019-11-17 DIAGNOSIS — J3089 Other allergic rhinitis: Secondary | ICD-10-CM | POA: Diagnosis not present

## 2019-11-30 ENCOUNTER — Other Ambulatory Visit: Payer: Self-pay | Admitting: Family Medicine

## 2019-11-30 DIAGNOSIS — M41115 Juvenile idiopathic scoliosis, thoracolumbar region: Secondary | ICD-10-CM

## 2019-11-30 DIAGNOSIS — M816 Localized osteoporosis [Lequesne]: Secondary | ICD-10-CM

## 2019-12-01 DIAGNOSIS — J3089 Other allergic rhinitis: Secondary | ICD-10-CM | POA: Diagnosis not present

## 2019-12-09 ENCOUNTER — Encounter (HOSPITAL_COMMUNITY): Payer: Self-pay

## 2019-12-09 ENCOUNTER — Emergency Department (HOSPITAL_COMMUNITY): Payer: Medicare Other

## 2019-12-09 ENCOUNTER — Other Ambulatory Visit: Payer: Self-pay

## 2019-12-09 ENCOUNTER — Inpatient Hospital Stay (HOSPITAL_COMMUNITY)
Admission: EM | Admit: 2019-12-09 | Discharge: 2019-12-15 | DRG: 481 | Disposition: A | Discharge status: Skilled Nursing Facility | Payer: Medicare Other | Attending: Internal Medicine | Admitting: Internal Medicine

## 2019-12-09 DIAGNOSIS — Z7401 Bed confinement status: Secondary | ICD-10-CM | POA: Diagnosis not present

## 2019-12-09 DIAGNOSIS — K5900 Constipation, unspecified: Secondary | ICD-10-CM | POA: Diagnosis not present

## 2019-12-09 DIAGNOSIS — Z791 Long term (current) use of non-steroidal anti-inflammatories (NSAID): Secondary | ICD-10-CM

## 2019-12-09 DIAGNOSIS — J4520 Mild intermittent asthma, uncomplicated: Secondary | ICD-10-CM | POA: Diagnosis not present

## 2019-12-09 DIAGNOSIS — Y92018 Other place in single-family (private) house as the place of occurrence of the external cause: Secondary | ICD-10-CM

## 2019-12-09 DIAGNOSIS — Z79899 Other long term (current) drug therapy: Secondary | ICD-10-CM | POA: Diagnosis not present

## 2019-12-09 DIAGNOSIS — Z7983 Long term (current) use of bisphosphonates: Secondary | ICD-10-CM | POA: Diagnosis not present

## 2019-12-09 DIAGNOSIS — S72332A Displaced oblique fracture of shaft of left femur, initial encounter for closed fracture: Secondary | ICD-10-CM | POA: Diagnosis not present

## 2019-12-09 DIAGNOSIS — T1490XA Injury, unspecified, initial encounter: Secondary | ICD-10-CM

## 2019-12-09 DIAGNOSIS — M2550 Pain in unspecified joint: Secondary | ICD-10-CM | POA: Diagnosis not present

## 2019-12-09 DIAGNOSIS — D62 Acute posthemorrhagic anemia: Secondary | ICD-10-CM | POA: Diagnosis not present

## 2019-12-09 DIAGNOSIS — R110 Nausea: Secondary | ICD-10-CM | POA: Diagnosis not present

## 2019-12-09 DIAGNOSIS — S72002A Fracture of unspecified part of neck of left femur, initial encounter for closed fracture: Secondary | ICD-10-CM

## 2019-12-09 DIAGNOSIS — R52 Pain, unspecified: Secondary | ICD-10-CM | POA: Diagnosis not present

## 2019-12-09 DIAGNOSIS — S7222XD Displaced subtrochanteric fracture of left femur, subsequent encounter for closed fracture with routine healing: Secondary | ICD-10-CM | POA: Diagnosis not present

## 2019-12-09 DIAGNOSIS — S7292XA Unspecified fracture of left femur, initial encounter for closed fracture: Secondary | ICD-10-CM

## 2019-12-09 DIAGNOSIS — Y9301 Activity, walking, marching and hiking: Secondary | ICD-10-CM | POA: Diagnosis present

## 2019-12-09 DIAGNOSIS — Z03818 Encounter for observation for suspected exposure to other biological agents ruled out: Secondary | ICD-10-CM | POA: Diagnosis not present

## 2019-12-09 DIAGNOSIS — R079 Chest pain, unspecified: Secondary | ICD-10-CM | POA: Diagnosis not present

## 2019-12-09 DIAGNOSIS — G629 Polyneuropathy, unspecified: Secondary | ICD-10-CM | POA: Diagnosis not present

## 2019-12-09 DIAGNOSIS — S7222XA Displaced subtrochanteric fracture of left femur, initial encounter for closed fracture: Secondary | ICD-10-CM | POA: Diagnosis not present

## 2019-12-09 DIAGNOSIS — W19XXXA Unspecified fall, initial encounter: Secondary | ICD-10-CM

## 2019-12-09 DIAGNOSIS — M25552 Pain in left hip: Secondary | ICD-10-CM | POA: Diagnosis present

## 2019-12-09 DIAGNOSIS — R0689 Other abnormalities of breathing: Secondary | ICD-10-CM | POA: Diagnosis not present

## 2019-12-09 DIAGNOSIS — K219 Gastro-esophageal reflux disease without esophagitis: Secondary | ICD-10-CM | POA: Diagnosis not present

## 2019-12-09 DIAGNOSIS — W010XXA Fall on same level from slipping, tripping and stumbling without subsequent striking against object, initial encounter: Secondary | ICD-10-CM | POA: Diagnosis present

## 2019-12-09 DIAGNOSIS — Z20822 Contact with and (suspected) exposure to covid-19: Secondary | ICD-10-CM | POA: Diagnosis not present

## 2019-12-09 DIAGNOSIS — Y92009 Unspecified place in unspecified non-institutional (private) residence as the place of occurrence of the external cause: Secondary | ICD-10-CM

## 2019-12-09 DIAGNOSIS — Z981 Arthrodesis status: Secondary | ICD-10-CM | POA: Diagnosis not present

## 2019-12-09 DIAGNOSIS — R0902 Hypoxemia: Secondary | ICD-10-CM | POA: Diagnosis not present

## 2019-12-09 DIAGNOSIS — Z9181 History of falling: Secondary | ICD-10-CM | POA: Diagnosis not present

## 2019-12-09 DIAGNOSIS — R609 Edema, unspecified: Secondary | ICD-10-CM | POA: Diagnosis not present

## 2019-12-09 DIAGNOSIS — I10 Essential (primary) hypertension: Secondary | ICD-10-CM | POA: Diagnosis present

## 2019-12-09 DIAGNOSIS — M6281 Muscle weakness (generalized): Secondary | ICD-10-CM | POA: Diagnosis not present

## 2019-12-09 DIAGNOSIS — M1990 Unspecified osteoarthritis, unspecified site: Secondary | ICD-10-CM | POA: Diagnosis not present

## 2019-12-09 DIAGNOSIS — R41841 Cognitive communication deficit: Secondary | ICD-10-CM | POA: Diagnosis not present

## 2019-12-09 DIAGNOSIS — M419 Scoliosis, unspecified: Secondary | ICD-10-CM | POA: Diagnosis not present

## 2019-12-09 DIAGNOSIS — R2689 Other abnormalities of gait and mobility: Secondary | ICD-10-CM | POA: Diagnosis not present

## 2019-12-09 HISTORY — DX: Unspecified osteoarthritis, unspecified site: M19.90

## 2019-12-09 HISTORY — DX: Unspecified hearing loss, unspecified ear: H91.90

## 2019-12-09 LAB — COMPREHENSIVE METABOLIC PANEL
ALT: 14 U/L (ref 0–44)
AST: 23 U/L (ref 15–41)
Albumin: 3.8 g/dL (ref 3.5–5.0)
Alkaline Phosphatase: 52 U/L (ref 38–126)
Anion gap: 10 (ref 5–15)
BUN: 15 mg/dL (ref 8–23)
CO2: 23 mmol/L (ref 22–32)
Calcium: 8.6 mg/dL — ABNORMAL LOW (ref 8.9–10.3)
Chloride: 106 mmol/L (ref 98–111)
Creatinine, Ser: 0.77 mg/dL (ref 0.44–1.00)
GFR calc Af Amer: >60 mL/min (ref >60)
GFR calc non Af Amer: >60 mL/min (ref >60)
Glucose, Bld: 128 mg/dL — ABNORMAL HIGH (ref 70–99)
Potassium: 3.4 mmol/L — ABNORMAL LOW (ref 3.5–5.1)
Sodium: 139 mmol/L (ref 135–145)
Total Bilirubin: 0.5 mg/dL (ref 0.3–1.2)
Total Protein: 6.1 g/dL — ABNORMAL LOW (ref 6.5–8.1)

## 2019-12-09 LAB — CBC WITH DIFFERENTIAL/PLATELET
Abs Immature Granulocytes: 0.05 10*3/uL (ref 0.00–0.07)
Basophils Absolute: 0 10*3/uL (ref 0.0–0.1)
Basophils Relative: 0 %
Eosinophils Absolute: 0.1 10*3/uL (ref 0.0–0.5)
Eosinophils Relative: 1 %
HCT: 37.3 % (ref 36.0–46.0)
Hemoglobin: 11.6 g/dL — ABNORMAL LOW (ref 12.0–15.0)
Immature Granulocytes: 1 %
Lymphocytes Relative: 21 %
Lymphs Abs: 2.1 10*3/uL (ref 0.7–4.0)
MCH: 28.1 pg (ref 26.0–34.0)
MCHC: 31.1 g/dL (ref 30.0–36.0)
MCV: 90.3 fL (ref 80.0–100.0)
Monocytes Absolute: 0.7 10*3/uL (ref 0.1–1.0)
Monocytes Relative: 7 %
Neutro Abs: 7.2 10*3/uL (ref 1.7–7.7)
Neutrophils Relative %: 70 %
Platelets: 174 10*3/uL (ref 150–400)
RBC: 4.13 MIL/uL (ref 3.87–5.11)
RDW: 13.4 % (ref 11.5–15.5)
WBC: 10.2 10*3/uL (ref 4.0–10.5)
nRBC: 0 % (ref 0.0–0.2)

## 2019-12-09 MED ORDER — SODIUM CHLORIDE 0.9 % IV SOLN: INTRAVENOUS | Status: DC

## 2019-12-09 MED ORDER — MORPHINE SULFATE (PF) 4 MG/ML IV SOLN
4.0000 mg | Freq: Once | INTRAVENOUS | Status: AC
  Administered 2019-12-09: 4 mg via INTRAVENOUS
  Filled 2019-12-09: qty 1

## 2019-12-09 MED ORDER — ONDANSETRON HCL 4 MG/2ML IJ SOLN
4.0000 mg | Freq: Once | INTRAMUSCULAR | Status: AC
  Administered 2019-12-09: 4 mg via INTRAVENOUS
  Filled 2019-12-09: qty 2

## 2019-12-09 NOTE — ED Notes (Signed)
Pt placed on 3L [place] as pt was oxygenating at upper 80% on RA

## 2019-12-09 NOTE — Progress Notes (Signed)
   12/09/19 2235  Clinical Encounter Type  Visited With Health care provider  Visit Type Initial;ED;Trauma   Chaplain responded to a trauma in the ED. Level II Trauma was downgraded to a non-trauma. Per unit secretary no needs at this time. Spiritual care services available as needed.   Jeri Lager, Chaplain

## 2019-12-09 NOTE — ED Notes (Signed)
Pt refused warm blankets at this time.

## 2019-12-09 NOTE — ED Provider Notes (Signed)
[facility] EMERGENCY DEPARTMENT Provider Note   CSN: [id number] Arrival date & time: 12/09/19  2232     History Chief Complaint  Patient presents with  . Trauma  . Fall    Savannah Dean is a 74 y.o. female.  HPI      Savannah Dean is a 74 y.o. female, with a history of HTN, presenting to the ED with left leg pain following a fall that occurred shortly prior to arrival. Patient states she was walking in her living room, lost her balance, and fell to the ground. She notes severe left upper leg and hip pain, throbbing, nonradiating. Denies anticoagulation. Patient reportedly received 10 mg. Denies head injury, LOC, other extremity pain, chest pain, shortness of breath, abdominal pain, neck/back pain, or any other complaints.  Past Medical History:  Diagnosis Date  . Hypertension     There are no problems to display for this patient.   History reviewed. No pertinent surgical history.   OB History   No obstetric history on file.     No family history on file.  Social History   Tobacco Use  . Smoking status: Not on file  Substance Use Topics  . Alcohol use: Not on file  . Drug use: Not on file    Home Medications Prior to Admission medications   Medication Sig Start Date End Date Taking? Authorizing Provider  alendronate (FOSAMAX) 70 MG tablet Take 70 mg by mouth every Saturday. 11/30/19   [provider]  EPINEPHrine 0.3 mg/0.3 mL IJ SOAJ injection as needed. 09/25/19   [provider]  fluticasone (FLONASE) 50 MCG/ACT nasal spray Place into both nostrils as needed. 08/16/19   [provider]  gabapentin (NEURONTIN) 100 MG capsule Take 100 mg by mouth in the morning and at bedtime. 11/07/19   [provider]  levocetirizine (XYZAL) 5 MG tablet Take 5 mg by mouth daily. 11/17/19   [provider]  losartan (COZAAR) 25 MG tablet Take 25 mg by mouth daily. 11/10/19   [provider]   meloxicam (MOBIC) 15 MG tablet Take 15 mg by mouth daily. 09/11/19   [provider]  omeprazole (PRILOSEC) 20 MG capsule Take 20 mg by mouth daily. 11/07/19   [provider]  [place] 782 (90 Base) MCG/ACT AEPB Inhale into the lungs as needed. 08/16/19   [provider]  tiZANidine (ZANAFLEX) 4 MG tablet Take 4 mg by mouth 3 (three) times daily. 11/30/19   [provider]    Allergies    Patient has no known allergies.  Review of Systems   Review of Systems  Constitutional: Negative for diaphoresis.  Respiratory: Negative for shortness of breath.   Cardiovascular: Negative for chest pain.  Gastrointestinal: Negative for abdominal pain, diarrhea, nausea and vomiting.  Musculoskeletal: Positive for arthralgias.  Neurological: Negative for weakness and numbness.  All other systems reviewed and are negative.   Physical Exam Updated Vital Signs BP (!) 156/76   Pulse 75   Temp 97.9 F (36.6 C) (Oral)   Resp 11   Ht 5\' 2"  (1.575 m)   Wt 68 kg   SpO2 98%   BMI 27.44 kg/m   Physical Exam Vitals and nursing note reviewed.  Constitutional:      General: She is not in acute distress.    Appearance: She is well-developed. She is not diaphoretic.  HENT:     Head: Normocephalic and atraumatic.     Mouth/Throat:  Mouth: Mucous membranes are moist.     Pharynx: Oropharynx is clear.  Eyes:     Conjunctiva/sclera: Conjunctivae normal.  Cardiovascular:     Rate and Rhythm: Normal rate and regular rhythm.     Pulses: Normal pulses.          Radial pulses are 2+ on the right side and 2+ on the left side.       Posterior tibial pulses are 2+ on the right side and 2+ on the left side.     Heart sounds: Normal heart sounds.     Comments: Tactile temperature in the extremities appropriate and equal bilaterally. Pulmonary:     Effort: Pulmonary effort is normal. No respiratory distress.     Breath sounds: Normal breath sounds.  Abdominal:      Palpations: Abdomen is soft.     Tenderness: There is no abdominal tenderness. There is no guarding.  Musculoskeletal:     Cervical back: Neck supple.     Right lower leg: No edema.     Left lower leg: No edema.     Comments: Swelling and likely deformity to the left upper thigh and hip. No tenderness, swelling, deformity, or instability to the wrist and left lower extremity, right lower extremity, or the upper extremities. Normal motor function intact in all extremities, even the left lower extremity distal to the area of injury. No midline spinal tenderness.  Overall trauma exam performed without any abnormalities noted other than those mentioned.  Lymphadenopathy:     Cervical: No cervical adenopathy.  Skin:    General: Skin is warm and dry.     Capillary Refill: Capillary refill takes less than 2 seconds.  Neurological:     Mental Status: She is alert.     Comments: Sensation light touch grossly intact in all 4 extremities. Appropriate motor function intact in the left lower extremity distal to the patient's injury. Strength 5/5 in the bilateral upper extremities as well as the right lower extremity. No facial droop or noted speech abnormality.  Psychiatric:        Mood and Affect: Mood and affect normal.        Speech: Speech normal.        Behavior: Behavior normal.     ED Results / Procedures / Treatments   Labs (all labs ordered are listed, but only abnormal results are displayed) Labs Reviewed  CBC WITH DIFFERENTIAL/PLATELET - Abnormal; Notable for the following components:      Result Value   Hemoglobin 11.6 (*)    All other components within normal limits  COMPREHENSIVE METABOLIC PANEL - Abnormal; Notable for the following components:   Potassium 3.4 (*)    Glucose, Bld 128 (*)    Calcium 8.6 (*)    Total Protein 6.1 (*)    All other components within normal limits  SARS CORONAVIRUS 2 BY RT PCR (HOSPITAL ORDER, [facility])    EKG  None  ED ECG REPORT   Date: 12/10/2019  Rate: 76  Rhythm: Sinus rhythm  QRS Axis: normal  Intervals: normal  ST/T Wave abnormalities: normal  Conduction Disutrbances:none  Narrative Interpretation:   Old EKG Reviewed: none available  I have personally reviewed the EKG tracing and agree with the computerized printout as noted.  Radiology DG Chest Port 1 View  Result Date: 12/09/2019 CLINICAL DATA:  Pain status post fall EXAM: PORTABLE CHEST 1 VIEW COMPARISON:  None. FINDINGS: There is significant elevation of the right  hemidiaphragm. No evidence for pneumothorax. No acute displaced fracture. The heart size is unremarkable. There are atherosclerotic changes of the thoracic aorta. IMPRESSION: 1. No acute cardiopulmonary process. 2. Significant elevation of the right hemidiaphragm. Electronically Signed   By: Constance Holster M.D.   On: 12/09/2019 23:10   DG FEMUR PORT 1V LEFT  Result Date: 12/09/2019 CLINICAL DATA:  Trauma, fell EXAM: LEFT FEMUR PORTABLE 1 VIEW COMPARISON:  None. FINDINGS: Three frontal views of the left femur are obtained. There is a mildly comminuted oblique proximal left femoral diaphyseal fracture with varus angulation at the fracture site. No evidence of hip dislocation. Distal femur is unremarkable. IMPRESSION: 1. Mildly comminuted oblique proximal left femoral diaphyseal fracture. Electronically Signed   By: Randa Ngo M.D.   On: 12/09/2019 23:10    Procedures Procedures (including critical care time)  Medications Ordered in ED Medications  0.9 %  sodium chloride infusion ( Intravenous New Bag/Given 12/09/19 2309)  ondansetron (ZOFRAN) injection 4 mg (4 mg Intravenous Given 12/09/19 2323)  morphine 4 MG/ML injection 4 mg (4 mg Intravenous Given 12/09/19 2323)    ED Course  I have reviewed the triage vital signs and the nursing notes.  Pertinent labs & imaging results that were available during my care of the patient were reviewed by me and considered in  my medical decision making (see chart for details).  Clinical Course as of Jul 11 0002  Sat Dec 09, 2019  2322 Spoke with Dr. Ninfa Linden, orthopedic surgeon. Admit patient via the medicine service.   N.p.o. after midnight. Have patient placed in Buck's traction. Expect surgery in the morning.   [SJ]  2337 Spoke with Dr. Tonie Griffith, hospitalist. Agrees to admit the patient.   [SJ]    Clinical Course User Index [SJ] Joy, Helane Gunther, PA-C   MDM Rules/Calculators/A&P                          Patient presents with a left leg injury following a fall. No evidence of neurovascular compromise distal to the injury.   I personally reviewed and interpreted the patient's labs and imaging studies. Subtrochanteric fracture noted on x-ray. Patient admitted with plan for surgery in the morning.  Findings and plan of care discussed with Ripley Fraise, MD.   Final Clinical Impression(s) / ED Diagnoses Final diagnoses:  Fall, initial encounter  Closed displaced subtrochanteric fracture of left femur, initial encounter [facility])    Rx / DC Orders ED Discharge Orders    None       Layla Maw 12/10/19 0004    Ripley Fraise, MD 12/10/19 [phone]

## 2019-12-09 NOTE — Progress Notes (Signed)
Patient ID: Savannah Dean, female   DOB: 30-Apr-1946, 74 y.o.   MRN: [id number] I have seen the patient's x-rays.  She has a left subtrochanteric femur fracture.  This will require fixation with an intramedullary nail.  Our plan will be to put her on the operating room schedule for tomorrow.  She should be n.p.o. after midnight tonight.  I would recommend at least 7 to 10 pounds of Buck's traction on the left lower extremity.  The ED will get the medicine service involved for admission and medical management/clearance.

## 2019-12-09 NOTE — ED Notes (Signed)
Per EDP, pt does not require a Manual BP

## 2019-12-09 NOTE — ED Provider Notes (Signed)
MSE was initiated and I personally evaluated the patient and placed orders (if any) at  11:01 PM on December 09, 2019.  The patient appears stable so that the remainder of the MSE may be completed by another provider.  Patient brought in by [facility].  Patient had a fall in her living room went down no loss of consciousness and then had left hip pain.  I think EMS thought this was more of a femur fracture so patient was brought here as a level 2 trauma.  This was immediately downgraded.  Patient's deformities proximal hip area.  Does have good cap refill distally.  No other injuries.  Patient's had both of the Covid vaccines.  X-ray portable of the left femur 1 view shows a subtrochanteric fracture.  Chest x-ray portable was ordered as well.  Patient's vital signs stable.   Fredia Sorrow, MD 12/09/19 [phone]

## 2019-12-09 NOTE — ED Triage Notes (Signed)
Pt BIB [facility] from home after a  Mechanical fall.  EMS on scene reported a "crunch" sound when moving pt.   Obvious deformity and swelling to upper L. Leg.   Pt given 10 mg Morphine en route.   VSS with EMS. A&Ox4, GCS 15

## 2019-12-09 NOTE — ED Provider Notes (Signed)
Patient seen/examined in the Emergency Department in conjunction with Advanced Practice Provider Joy Patient reports left leg pain after fall Exam : awake/alert, uncomfortable appearing.  Left LE is shortened and rotated.  Distal pulses intact Plan: admit for left hip fracture   ED ECG REPORT   Date: 12/09/2019 2243  Rate: 76  Rhythm: normal sinus rhythm  QRS Axis: normal  Intervals: normal  ST/T Wave abnormalities: nonspecific ST changes  Conduction Disutrbances:none  Narrative Interpretation:   Old EKG Reviewed: none available  I have personally reviewed the EKG tracing and agree with the computerized printout as noted.    Ripley Fraise, MD 12/09/19 2333

## 2019-12-10 ENCOUNTER — Inpatient Hospital Stay (HOSPITAL_COMMUNITY): Payer: Medicare Other | Admitting: Certified Registered Nurse Anesthetist

## 2019-12-10 ENCOUNTER — Inpatient Hospital Stay (HOSPITAL_COMMUNITY): Payer: Medicare Other

## 2019-12-10 ENCOUNTER — Encounter (HOSPITAL_COMMUNITY): Payer: Self-pay | Admitting: Family Medicine

## 2019-12-10 ENCOUNTER — Encounter (HOSPITAL_COMMUNITY)
Admission: EM | Disposition: A | Discharge status: Skilled Nursing Facility | Payer: Self-pay | Source: Home / Self Care | Attending: Family Medicine

## 2019-12-10 DIAGNOSIS — S7222XA Displaced subtrochanteric fracture of left femur, initial encounter for closed fracture: Secondary | ICD-10-CM | POA: Insufficient documentation

## 2019-12-10 DIAGNOSIS — Z7983 Long term (current) use of bisphosphonates: Secondary | ICD-10-CM | POA: Diagnosis not present

## 2019-12-10 DIAGNOSIS — Y92018 Other place in single-family (private) house as the place of occurrence of the external cause: Secondary | ICD-10-CM | POA: Diagnosis not present

## 2019-12-10 DIAGNOSIS — R110 Nausea: Secondary | ICD-10-CM | POA: Diagnosis not present

## 2019-12-10 DIAGNOSIS — W19XXXA Unspecified fall, initial encounter: Secondary | ICD-10-CM

## 2019-12-10 DIAGNOSIS — M25552 Pain in left hip: Secondary | ICD-10-CM | POA: Diagnosis present

## 2019-12-10 DIAGNOSIS — Z20822 Contact with and (suspected) exposure to covid-19: Secondary | ICD-10-CM | POA: Diagnosis present

## 2019-12-10 DIAGNOSIS — I10 Essential (primary) hypertension: Secondary | ICD-10-CM | POA: Diagnosis present

## 2019-12-10 DIAGNOSIS — Z79899 Other long term (current) drug therapy: Secondary | ICD-10-CM | POA: Diagnosis not present

## 2019-12-10 DIAGNOSIS — W010XXA Fall on same level from slipping, tripping and stumbling without subsequent striking against object, initial encounter: Secondary | ICD-10-CM | POA: Diagnosis present

## 2019-12-10 DIAGNOSIS — Y92009 Unspecified place in unspecified non-institutional (private) residence as the place of occurrence of the external cause: Secondary | ICD-10-CM

## 2019-12-10 DIAGNOSIS — D62 Acute posthemorrhagic anemia: Secondary | ICD-10-CM | POA: Diagnosis not present

## 2019-12-10 DIAGNOSIS — Y9301 Activity, walking, marching and hiking: Secondary | ICD-10-CM | POA: Diagnosis present

## 2019-12-10 DIAGNOSIS — S72002A Fracture of unspecified part of neck of left femur, initial encounter for closed fracture: Secondary | ICD-10-CM | POA: Diagnosis not present

## 2019-12-10 DIAGNOSIS — K5900 Constipation, unspecified: Secondary | ICD-10-CM | POA: Diagnosis not present

## 2019-12-10 DIAGNOSIS — Z981 Arthrodesis status: Secondary | ICD-10-CM | POA: Diagnosis not present

## 2019-12-10 DIAGNOSIS — Z791 Long term (current) use of non-steroidal anti-inflammatories (NSAID): Secondary | ICD-10-CM | POA: Diagnosis not present

## 2019-12-10 DIAGNOSIS — M1990 Unspecified osteoarthritis, unspecified site: Secondary | ICD-10-CM | POA: Diagnosis present

## 2019-12-10 HISTORY — DX: Unspecified fall, initial encounter: W19.XXXA

## 2019-12-10 HISTORY — DX: Unspecified fall, initial encounter: Y92.009

## 2019-12-10 HISTORY — DX: Fracture of unspecified part of neck of left femur, initial encounter for closed fracture: S72.002A

## 2019-12-10 HISTORY — PX: INTRAMEDULLARY (IM) NAIL INTERTROCHANTERIC: SHX5875

## 2019-12-10 LAB — CBC
HCT: 37.9 % (ref 36.0–46.0)
Hemoglobin: 11.7 g/dL — ABNORMAL LOW (ref 12.0–15.0)
MCH: 28.5 pg (ref 26.0–34.0)
MCHC: 30.9 g/dL (ref 30.0–36.0)
MCV: 92.2 fL (ref 80.0–100.0)
Platelets: 179 10*3/uL (ref 150–400)
RBC: 4.11 MIL/uL (ref 3.87–5.11)
RDW: 13.6 % (ref 11.5–15.5)
WBC: 11.7 10*3/uL — ABNORMAL HIGH (ref 4.0–10.5)
nRBC: 0 % (ref 0.0–0.2)

## 2019-12-10 LAB — BASIC METABOLIC PANEL
Anion gap: 9 (ref 5–15)
BUN: 13 mg/dL (ref 8–23)
CO2: 27 mmol/L (ref 22–32)
Calcium: 8.9 mg/dL (ref 8.9–10.3)
Chloride: 102 mmol/L (ref 98–111)
Creatinine, Ser: 0.68 mg/dL (ref 0.44–1.00)
GFR calc Af Amer: >60 mL/min (ref >60)
GFR calc non Af Amer: >60 mL/min (ref >60)
Glucose, Bld: 142 mg/dL — ABNORMAL HIGH (ref 70–99)
Potassium: 4.6 mmol/L (ref 3.5–5.1)
Sodium: 138 mmol/L (ref 135–145)

## 2019-12-10 LAB — SARS CORONAVIRUS 2 BY RT PCR (HOSPITAL ORDER, PERFORMED IN ~~LOC~~ HOSPITAL LAB): SARS Coronavirus 2: NEGATIVE

## 2019-12-10 SURGERY — FIXATION, FRACTURE, INTERTROCHANTERIC, WITH INTRAMEDULLARY ROD
Anesthesia: General | Laterality: Left

## 2019-12-10 MED ORDER — CEFAZOLIN SODIUM-DEXTROSE 2-4 GM/100ML-% IV SOLN
2.0000 g | Freq: Four times a day (QID) | INTRAVENOUS | Status: AC
  Administered 2019-12-10: 2 g via INTRAVENOUS
  Filled 2019-12-10 (×2): qty 100

## 2019-12-10 MED ORDER — TIZANIDINE HCL 4 MG PO TABS
4.0000 mg | ORAL_TABLET | Freq: Three times a day (TID) | ORAL | Status: DC
  Administered 2019-12-10 – 2019-12-15 (×15): 4 mg via ORAL
  Filled 2019-12-10 (×15): qty 1

## 2019-12-10 MED ORDER — MENTHOL 3 MG MT LOZG: 1.0000 | LOZENGE | OROMUCOSAL | Status: DC | PRN

## 2019-12-10 MED ORDER — ONDANSETRON HCL 4 MG PO TABS
4.0000 mg | ORAL_TABLET | Freq: Four times a day (QID) | ORAL | Status: DC | PRN
  Administered 2019-12-15: 4 mg via ORAL
  Filled 2019-12-10: qty 1

## 2019-12-10 MED ORDER — FENTANYL CITRATE (PF) 100 MCG/2ML IJ SOLN
INTRAMUSCULAR | Status: DC | PRN
  Administered 2019-12-10 (×2): 25 ug via INTRAVENOUS

## 2019-12-10 MED ORDER — OXYCODONE HCL 5 MG/5ML PO SOLN: 5.0000 mg | Freq: Once | ORAL | Status: DC | PRN

## 2019-12-10 MED ORDER — ONDANSETRON HCL 4 MG/2ML IJ SOLN: 4.0000 mg | Freq: Once | INTRAMUSCULAR | Status: DC | PRN

## 2019-12-10 MED ORDER — ACETAMINOPHEN 325 MG PO TABS
325.0000 mg | ORAL_TABLET | Freq: Four times a day (QID) | ORAL | Status: DC | PRN
  Administered 2019-12-13: 650 mg via ORAL
  Filled 2019-12-10: qty 2

## 2019-12-10 MED ORDER — EPHEDRINE 5 MG/ML INJ
INTRAVENOUS | Status: AC
  Filled 2019-12-10: qty 10

## 2019-12-10 MED ORDER — ORAL CARE MOUTH RINSE: 15.0000 mL | Freq: Once | OROMUCOSAL | Status: AC

## 2019-12-10 MED ORDER — CHLORHEXIDINE GLUCONATE 0.12 % MT SOLN
OROMUCOSAL | Status: AC
  Administered 2019-12-10: 15 mL via OROMUCOSAL
  Filled 2019-12-10: qty 15

## 2019-12-10 MED ORDER — ONDANSETRON HCL 4 MG/2ML IJ SOLN
INTRAMUSCULAR | Status: DC | PRN
  Administered 2019-12-10: 4 mg via INTRAVENOUS

## 2019-12-10 MED ORDER — OXYCODONE HCL 5 MG PO TABS: 5.0000 mg | ORAL_TABLET | Freq: Once | ORAL | Status: DC | PRN

## 2019-12-10 MED ORDER — ASPIRIN EC 325 MG PO TBEC
325.0000 mg | DELAYED_RELEASE_TABLET | Freq: Every day | ORAL | Status: DC
  Administered 2019-12-11 – 2019-12-15 (×5): 325 mg via ORAL
  Filled 2019-12-10 (×5): qty 1

## 2019-12-10 MED ORDER — CEFAZOLIN SODIUM-DEXTROSE 2-4 GM/100ML-% IV SOLN
INTRAVENOUS | Status: AC
  Administered 2019-12-10: 2 g via INTRAVENOUS
  Filled 2019-12-10: qty 100

## 2019-12-10 MED ORDER — LOSARTAN POTASSIUM 50 MG PO TABS
25.0000 mg | ORAL_TABLET | Freq: Every day | ORAL | Status: DC
  Administered 2019-12-11 – 2019-12-15 (×5): 25 mg via ORAL
  Filled 2019-12-10 (×5): qty 1

## 2019-12-10 MED ORDER — PHENOL 1.4 % MT LIQD: 1.0000 | OROMUCOSAL | Status: DC | PRN

## 2019-12-10 MED ORDER — EPHEDRINE SULFATE-NACL 50-0.9 MG/10ML-% IV SOSY
PREFILLED_SYRINGE | INTRAVENOUS | Status: DC | PRN
  Administered 2019-12-10: 10 mg via INTRAVENOUS
  Administered 2019-12-10: 5 mg via INTRAVENOUS

## 2019-12-10 MED ORDER — DOCUSATE SODIUM 100 MG PO CAPS
100.0000 mg | ORAL_CAPSULE | Freq: Two times a day (BID) | ORAL | Status: DC
  Administered 2019-12-10 – 2019-12-15 (×10): 100 mg via ORAL
  Filled 2019-12-10 (×10): qty 1

## 2019-12-10 MED ORDER — LACTATED RINGERS IV SOLN: INTRAVENOUS | Status: DC

## 2019-12-10 MED ORDER — FENTANYL CITRATE (PF) 100 MCG/2ML IJ SOLN
25.0000 ug | INTRAMUSCULAR | Status: DC | PRN
  Administered 2019-12-10: 25 ug via INTRAVENOUS

## 2019-12-10 MED ORDER — ALBUMIN HUMAN 5 % IV SOLN
INTRAVENOUS | Status: AC
  Filled 2019-12-10: qty 250

## 2019-12-10 MED ORDER — HYDROMORPHONE HCL 1 MG/ML IJ SOLN: 0.5000 mg | INTRAMUSCULAR | Status: DC | PRN

## 2019-12-10 MED ORDER — CHLORHEXIDINE GLUCONATE 0.12 % MT SOLN: 15.0000 mL | Freq: Once | OROMUCOSAL | Status: AC

## 2019-12-10 MED ORDER — PROPOFOL 10 MG/ML IV BOLUS
INTRAVENOUS | Status: DC | PRN
  Administered 2019-12-10 (×2): 50 mg via INTRAVENOUS
  Administered 2019-12-10: 100 mg via INTRAVENOUS

## 2019-12-10 MED ORDER — PROPOFOL 10 MG/ML IV BOLUS
INTRAVENOUS | Status: AC
  Filled 2019-12-10: qty 20

## 2019-12-10 MED ORDER — OXYCODONE HCL 5 MG PO TABS
5.0000 mg | ORAL_TABLET | ORAL | Status: DC | PRN
  Administered 2019-12-11 – 2019-12-15 (×6): 5 mg via ORAL
  Filled 2019-12-10 (×6): qty 1

## 2019-12-10 MED ORDER — ONDANSETRON HCL 4 MG/2ML IJ SOLN
4.0000 mg | Freq: Four times a day (QID) | INTRAMUSCULAR | Status: DC | PRN
  Administered 2019-12-13 – 2019-12-14 (×4): 4 mg via INTRAVENOUS
  Filled 2019-12-10 (×4): qty 2

## 2019-12-10 MED ORDER — CEFAZOLIN SODIUM-DEXTROSE 2-4 GM/100ML-% IV SOLN
2.0000 g | Freq: Once | INTRAVENOUS | Status: AC
  Administered 2019-12-10: 2 g via INTRAVENOUS

## 2019-12-10 MED ORDER — PHENYLEPHRINE HCL-NACL 10-0.9 MG/250ML-% IV SOLN
INTRAVENOUS | Status: DC | PRN
  Administered 2019-12-10: 30 ug/min via INTRAVENOUS

## 2019-12-10 MED ORDER — METOCLOPRAMIDE HCL 5 MG/ML IJ SOLN
5.0000 mg | Freq: Three times a day (TID) | INTRAMUSCULAR | Status: DC | PRN
  Administered 2019-12-13: 10 mg via INTRAVENOUS
  Filled 2019-12-10: qty 2

## 2019-12-10 MED ORDER — METOCLOPRAMIDE HCL 5 MG PO TABS: 5.0000 mg | ORAL_TABLET | Freq: Three times a day (TID) | ORAL | Status: DC | PRN

## 2019-12-10 MED ORDER — OXYCODONE HCL 5 MG PO TABS
10.0000 mg | ORAL_TABLET | ORAL | Status: DC | PRN
  Administered 2019-12-10: 15 mg via ORAL
  Filled 2019-12-10: qty 3

## 2019-12-10 MED ORDER — METHOCARBAMOL 1000 MG/10ML IJ SOLN
500.0000 mg | Freq: Four times a day (QID) | INTRAVENOUS | Status: DC | PRN
  Filled 2019-12-10: qty 5

## 2019-12-10 MED ORDER — DEXAMETHASONE SODIUM PHOSPHATE 4 MG/ML IJ SOLN
INTRAMUSCULAR | Status: DC | PRN
  Administered 2019-12-10: 10 mg via INTRAVENOUS

## 2019-12-10 MED ORDER — GABAPENTIN 100 MG PO CAPS
100.0000 mg | ORAL_CAPSULE | Freq: Two times a day (BID) | ORAL | Status: DC
  Administered 2019-12-10 – 2019-12-15 (×10): 100 mg via ORAL
  Filled 2019-12-10 (×10): qty 1

## 2019-12-10 MED ORDER — DOCUSATE SODIUM 100 MG PO CAPS: 100.0000 mg | ORAL_CAPSULE | Freq: Two times a day (BID) | ORAL | Status: DC

## 2019-12-10 MED ORDER — OXYCODONE HCL 5 MG PO TABS: 5.0000 mg | ORAL_TABLET | ORAL | Status: DC | PRN

## 2019-12-10 MED ORDER — 0.9 % SODIUM CHLORIDE (POUR BTL) OPTIME
TOPICAL | Status: DC | PRN
  Administered 2019-12-10: 1000 mL

## 2019-12-10 MED ORDER — FENTANYL CITRATE (PF) 100 MCG/2ML IJ SOLN
INTRAMUSCULAR | Status: AC
  Filled 2019-12-10: qty 2

## 2019-12-10 MED ORDER — FENTANYL CITRATE (PF) 250 MCG/5ML IJ SOLN
INTRAMUSCULAR | Status: AC
  Filled 2019-12-10: qty 5

## 2019-12-10 MED ORDER — HYDROMORPHONE HCL 1 MG/ML IJ SOLN
0.5000 mg | INTRAMUSCULAR | Status: DC | PRN
  Administered 2019-12-10 (×2): 0.5 mg via INTRAVENOUS
  Filled 2019-12-10 (×2): qty 1

## 2019-12-10 MED ORDER — LIDOCAINE 2% (20 MG/ML) 5 ML SYRINGE
INTRAMUSCULAR | Status: DC | PRN
  Administered 2019-12-10: 60 mg via INTRAVENOUS

## 2019-12-10 MED ORDER — METHOCARBAMOL 500 MG PO TABS
500.0000 mg | ORAL_TABLET | Freq: Four times a day (QID) | ORAL | Status: DC | PRN
  Administered 2019-12-10 – 2019-12-15 (×5): 500 mg via ORAL
  Filled 2019-12-10 (×6): qty 1

## 2019-12-10 MED ORDER — ALBUMIN HUMAN 5 % IV SOLN
12.5000 g | Freq: Once | INTRAVENOUS | Status: AC
  Administered 2019-12-10: 12.5 g via INTRAVENOUS

## 2019-12-10 MED ORDER — HYDROCODONE-ACETAMINOPHEN 5-325 MG PO TABS
1.0000 | ORAL_TABLET | Freq: Four times a day (QID) | ORAL | Status: DC | PRN
  Administered 2019-12-10: 1 via ORAL
  Administered 2019-12-14: 2 via ORAL
  Administered 2019-12-15 (×2): 1 via ORAL
  Filled 2019-12-10 (×6): qty 1

## 2019-12-10 MED ORDER — EPHEDRINE SULFATE 50 MG/ML IJ SOLN
10.0000 mg | Freq: Once | INTRAMUSCULAR | Status: AC
  Administered 2019-12-10: 10 mg via INTRAVENOUS

## 2019-12-10 SURGICAL SUPPLY — 48 items
BIT DRILL 4.3MMS DISTAL GRDTED (BIT) ×1 IMPLANT
BLADE SURG 15 STRL LF DISP TIS (BLADE) ×1 IMPLANT
BLADE SURG 15 STRL SS (BLADE) ×1
BNDG GAUZE ELAST 4 BULKY (GAUZE/BANDAGES/DRESSINGS) ×2 IMPLANT
COVER PERINEAL POST (MISCELLANEOUS) ×2 IMPLANT
COVER SURGICAL LIGHT HANDLE (MISCELLANEOUS) ×2 IMPLANT
DRAPE C-ARM 42X72 X-RAY (DRAPES) ×2 IMPLANT
DRAPE C-ARMOR (DRAPES) ×2 IMPLANT
DRAPE INCISE IOBAN 66X45 STRL (DRAPES) ×2 IMPLANT
DRAPE STERI IOBAN 125X83 (DRAPES) ×2 IMPLANT
DRILL 4.3MMS DISTAL GRADUATED (BIT) ×2
DRSG AQUACEL AG ADV 3.5X 4 (GAUZE/BANDAGES/DRESSINGS) ×6 IMPLANT
DRSG MEPILEX BORDER 4X4 (GAUZE/BANDAGES/DRESSINGS) IMPLANT
DRSG MEPILEX BORDER 4X8 (GAUZE/BANDAGES/DRESSINGS) IMPLANT
DRSG PAD ABDOMINAL 8X10 ST (GAUZE/BANDAGES/DRESSINGS) IMPLANT
DURAPREP 26ML APPLICATOR (WOUND CARE) ×2 IMPLANT
ELECT REM PT RETURN 9FT ADLT (ELECTROSURGICAL) ×2
ELECTRODE REM PT RTRN 9FT ADLT (ELECTROSURGICAL) ×1 IMPLANT
FACESHIELD WRAPAROUND (MASK) ×2 IMPLANT
GAUZE XEROFORM 5X9 LF (GAUZE/BANDAGES/DRESSINGS) ×2 IMPLANT
GLOVE BIOGEL PI IND STRL 8 (GLOVE) ×2 IMPLANT
GLOVE BIOGEL PI INDICATOR 8 (GLOVE) ×2
GLOVE ORTHO TXT STRL SZ7.5 (GLOVE) ×2 IMPLANT
GLOVE SURG ORTHO 8.0 STRL STRW (GLOVE) ×2 IMPLANT
GOWN STRL REUS W/ TWL LRG LVL3 (GOWN DISPOSABLE) ×1 IMPLANT
GOWN STRL REUS W/ TWL XL LVL3 (GOWN DISPOSABLE) ×2 IMPLANT
GOWN STRL REUS W/TWL LRG LVL3 (GOWN DISPOSABLE) ×1
GOWN STRL REUS W/TWL XL LVL3 (GOWN DISPOSABLE) ×2
GUIDEPIN 3.2X17.5 THRD DISP (PIN) ×2 IMPLANT
GUIDEWIRE BALL NOSE 80CM (WIRE) ×2 IMPLANT
HFN LH 130 DEG 11MM X 380MM (Orthopedic Implant) ×2 IMPLANT
KIT BASIN OR (CUSTOM PROCEDURE TRAY) ×2 IMPLANT
KIT TURNOVER KIT B (KITS) ×2 IMPLANT
NS IRRIG 1000ML POUR BTL (IV SOLUTION) ×2 IMPLANT
PACK GENERAL/GYN (CUSTOM PROCEDURE TRAY) ×2 IMPLANT
PAD ARMBOARD 7.5X6 YLW CONV (MISCELLANEOUS) ×4 IMPLANT
PAD CAST 4YDX4 CTTN HI CHSV (CAST SUPPLIES) IMPLANT
PADDING CAST COTTON 4X4 STRL (CAST SUPPLIES)
SCREW BONE CORTICAL 5.0X44 (Screw) ×2 IMPLANT
SCREW LAG HIP NAIL 10.5X95 (Screw) ×2 IMPLANT
SCREWDRIVER HEX TIP 3.5MM (MISCELLANEOUS) ×2 IMPLANT
STAPLER VISISTAT 35W (STAPLE) ×2 IMPLANT
SUT VIC AB 0 CT1 27 (SUTURE) ×2
SUT VIC AB 0 CT1 27XBRD ANBCTR (SUTURE) ×2 IMPLANT
SUT VIC AB 2-0 CT1 27 (SUTURE) ×2
SUT VIC AB 2-0 CT1 TAPERPNT 27 (SUTURE) ×2 IMPLANT
TOWEL GREEN STERILE (TOWEL DISPOSABLE) ×2 IMPLANT
WATER STERILE IRR 1000ML POUR (IV SOLUTION) ×2 IMPLANT

## 2019-12-10 NOTE — Anesthesia Postprocedure Evaluation (Signed)
Anesthesia Post Note  Patient: Savannah Dean  Procedure(s) Performed: INTRAMEDULLARY (IM) NAIL SUBTROCHANTRIC (Left )     Patient location during evaluation: PACU Anesthesia Type: General Level of consciousness: awake and alert Pain management: pain level controlled Vital Signs Assessment: post-procedure vital signs reviewed and stable Respiratory status: spontaneous breathing, nonlabored ventilation and respiratory function stable Cardiovascular status: blood pressure returned to baseline and stable Postop Assessment: no apparent nausea or vomiting Anesthetic complications: no   No complications documented.  Last Vitals:  Vitals:   12/10/19 1200 12/10/19 1215  BP: (!) 101/43 (!) 103/50  Pulse: 68 70  Resp: 12 16  Temp:    SpO2: 97% 93%      LLE Motor Response: Purposeful movement;Responds to commands (12/10/19 1215) LLE Sensation: Full sensation (12/10/19 1215)          Audry Pili

## 2019-12-10 NOTE — Progress Notes (Signed)
Orthopedic Tech Progress Note Patient Details:  Savannah Dean [birth year] [id number]  Musculoskeletal Traction Type of Traction: Bucks Skin Traction Traction Location: lle Traction Weight: 5 lbs   Post Interventions Patient Tolerated: Well Instructions Provided: Care of device, Adjustment of device   Karolee Stamps 12/10/2019, 12:53 AM

## 2019-12-10 NOTE — Progress Notes (Signed)
   12/10/19 1756  Assess: MEWS Score  Temp 97.8 F (36.6 C)  BP (!) 72/46  Pulse Rate 68  Resp 16  Level of Consciousness Alert  SpO2 95 %  O2 Device Nasal Cannula  O2 Flow Rate (L/min) 2 L/min  Assess: MEWS Score  MEWS Temp 0  MEWS Systolic 2  MEWS Pulse 0  MEWS RR 0  MEWS LOC 0  MEWS Score 2  MEWS Score Color Yellow  Assess: if the MEWS score is Yellow or Red  Were vital signs taken at a resting state? Yes  Focused Assessment Documented focused assessment  Early Detection of Sepsis Score *See Row Information* Low  MEWS guidelines implemented *See Row Information* Yes  Treat  MEWS Interventions Other (Comment)  Take Vital Signs  Increase Vital Sign Frequency  Yellow: Q 2hr X 2 then Q 4hr X 2, if remains yellow, continue Q 4hrs  Escalate  MEWS: Escalate Yellow: discuss with charge nurse/RN and consider discussing with provider and RRT  Notify: Charge Nurse/RN  Name of Charge Nurse/RN Notified Sharyn Lull RN  Date Charge Nurse/RN Notified 12/10/19  Time Charge Nurse/RN Notified 1800  Notify: Provider  Provider Name/Title  (Dr. Darrick Meigs)  Date Provider Notified 12/10/19  Notification Type Page  Notification Reason Other (Comment) (MEWS BP Low)  Response See new orders  Date of Provider Response 12/10/19  Time of Provider Response 1821  Document  Patient Outcome Other (Comment) (Patient had no symptoms. See focused Assessment )  Progress note created (see row info) Yes

## 2019-12-10 NOTE — ED Notes (Signed)
Ortho Technician at bedside.

## 2019-12-10 NOTE — ED Notes (Signed)
Pt transferred to Hospital Bed; Ortho Technician en route to place [facility] Traction

## 2019-12-10 NOTE — Progress Notes (Signed)
Subjective: Patient admitted this morning, see detailed H&P by Dr Tonie Griffith 74 year old female with history of hypertension, arthritis, scoliosis who was brought by EMS after fall at home which resulted in severe left hip pain.  X-ray showed left hip fracture.  Orthopedics was consulted.  Patient underwent intramedullary nail for subtrochanteric femur fracture.  Vitals:   12/10/19 1326 12/10/19 1337  BP: (!) 113/54   Pulse: 70   Resp: 16   Temp: 97.7 F (36.5 C)   SpO2:  94%      A/P  S/p intramedullary nail for subtrochanteric left femur fracture-orthopedics following  Hypertension-continue home medications    [place] Hospitalist [phone]

## 2019-12-10 NOTE — Anesthesia Preprocedure Evaluation (Addendum)
Anesthesia Evaluation  Patient identified by MRN, date of birth, ID band Patient awake    Reviewed: Allergy & Precautions, NPO status , Patient's Chart, lab work & pertinent test results  History of Anesthesia Complications Negative for: history of anesthetic complications  Airway Mallampati: II  TM Distance: >3 FB Neck ROM: Full    Dental  (+) Edentulous Upper, Edentulous Lower   Pulmonary neg pulmonary ROS,    Pulmonary exam normal        Cardiovascular hypertension, Pt. on medications Normal cardiovascular exam     Neuro/Psych negative neurological ROS  negative psych ROS   GI/Hepatic Neg liver ROS, GERD  Medicated and Controlled,  Endo/Other  negative endocrine ROS  Renal/GU negative Renal ROS     Musculoskeletal  (+) Arthritis ,  Hx spinal surgery for scoliosis at a child    Abdominal   Peds  Hematology negative hematology ROS (+)   Anesthesia Other Findings Covid test negative   Reproductive/Obstetrics                            Anesthesia Physical Anesthesia Plan  ASA: II  Anesthesia Plan: General   Post-op Pain Management:    Induction: Intravenous  PONV Risk Score and Plan: 4 or greater and Treatment may vary due to age or medical condition, Ondansetron and Dexamethasone  Airway Management Planned: LMA  Additional Equipment: None  Intra-op Plan:   Post-operative Plan: Extubation in OR  Informed Consent: I have reviewed the patients History and Physical, chart, labs and discussed the procedure including the risks, benefits and alternatives for the proposed anesthesia with the patient or authorized representative who has indicated his/her understanding and acceptance.     Dental advisory given  Plan Discussed with: CRNA and Anesthesiologist  Anesthesia Plan Comments:        Anesthesia Quick Evaluation

## 2019-12-10 NOTE — Brief Op Note (Signed)
12/09/2019 - 12/10/2019  10:52 AM  PATIENT:  Savannah Dean  74 y.o. female  PRE-OPERATIVE DIAGNOSIS:  LEFT SUBTROCHANTERIC FEMUR FRACTURE  POST-OPERATIVE DIAGNOSIS:  LEFT SUBTROCHANTERIC FEMUR FRACTURE  PROCEDURE:  Procedure(s): INTRAMEDULLARY (IM) NAIL SUBTROCHANTRIC (Left)  SURGEON:  Surgeon(s) and Role:    * Mcarthur Rossetti, MD - Primary  PHYSICIAN ASSISTANT: Benita Stabile, PA-C  ANESTHESIA:   general  EBL:  150 mL   COUNTS:  YES  DICTATION: .Other Dictation: Dictation Number [id number]  PLAN OF CARE: Admit to inpatient   PATIENT DISPOSITION:  PACU - hemodynamically stable.   Delay start of Pharmacological VTE agent (>24hrs) due to surgical blood loss or risk of bleeding: no

## 2019-12-10 NOTE — Transfer of Care (Signed)
Immediate Anesthesia Transfer of Care Note  Patient: Savannah Dean  Procedure(s) Performed: INTRAMEDULLARY (IM) NAIL SUBTROCHANTRIC (Left )  Patient Location: PACU  Anesthesia Type:General  Level of Consciousness: awake, alert  and oriented  Airway & Oxygen Therapy: Patient Spontanous Breathing and Patient connected to nasal cannula oxygen  Post-op Assessment: Report given to RN and Post -op Vital signs reviewed and stable  Post vital signs: Reviewed and stable  Last Vitals:  Vitals Value Taken Time  BP 86/48 12/10/19 1119  Temp    Pulse 66 12/10/19 1119  Resp 11 12/10/19 1119  SpO2 98 % 12/10/19 1119  Vitals shown include unvalidated device data.  Last Pain:  Vitals:   12/10/19 0607  TempSrc:   PainSc: Asleep         Complications: No complications documented.

## 2019-12-10 NOTE — Plan of Care (Signed)
  Problem: Education: Goal: Knowledge of General Education information will improve Description: Including pain rating scale, medication(s)/side effects and non-pharmacologic comfort measures Outcome: Progressing   Problem: Pain Managment: Goal: General experience of comfort will improve Outcome: Progressing   Problem: Safety: Goal: Ability to remain free from injury will improve Outcome: Progressing   

## 2019-12-10 NOTE — Anesthesia Procedure Notes (Signed)
Procedure Name: LMA Insertion Date/Time: 12/10/2019 9:19 AM Performed by: Griffin Dakin, CRNA Pre-anesthesia Checklist: Patient identified, Emergency Drugs available, Suction available and Patient being monitored Patient Re-evaluated:Patient Re-evaluated prior to induction Oxygen Delivery Method: Circle system utilized Preoxygenation: Pre-oxygenation with 100% oxygen Induction Type: IV induction LMA: LMA inserted LMA Size: 4.5 Number of attempts: 1 Placement Confirmation: positive ETCO2 and breath sounds checked- equal and bilateral Tube secured with: Tape Dental Injury: Teeth and Oropharynx as per pre-operative assessment

## 2019-12-10 NOTE — Consult Note (Signed)
Reason for Consult:  Left proximal femur fracture Referring Physician:  Christy Gentles, MD/EDP  Savannah Dean is an 74 y.o. female.  HPI: The patient is a 74 year old female who sustained a mechanical fall late yesterday while she was at home.  She denies any syncopal episode or loss consciousness.  She has had a history of fall she states she is a spinal fusion from previous surgery remotely.  This time she landed hard on her left hip.  She was brought to the [facility] emergency room and found to have a left subtrochanteric proximal femur fracture.  She is placed in Buck's traction and graciously admitted to the medicine service for medical evaluation and clearance for surgery today.  I have seen the patient in the emergency room and explained in detail what her treatment plan is for this injury.  She only reports left hip pain and denies any other injuries.  Past Medical History:  Diagnosis Date  . Arthritis   . Hypertension     Past Surgical History:  Procedure Laterality Date  . NO PAST SURGERIES      History reviewed. No pertinent family history.  Social History:  reports that she has never smoked. She has never used smokeless tobacco. She reports that she does not drink alcohol and does not use drugs.  Allergies: No Known Allergies  Medications: I have reviewed the patient's current medications.  Results for orders placed or performed during the hospital encounter of 12/09/19 (from the past 48 hour(s))  CBC with Differential     Status: Abnormal   Collection Time: 12/09/19 10:44 PM  Result Value Ref Range   WBC 10.2 4.0 - 10.5 K/uL   RBC 4.13 3.87 - 5.11 MIL/uL   Hemoglobin 11.6 (L) 12.0 - 15.0 g/dL   HCT 37.3 36 - 46 %   MCV 90.3 80.0 - 100.0 fL   MCH 28.1 26.0 - 34.0 pg   MCHC 31.1 30.0 - 36.0 g/dL   RDW 13.4 11.5 - 15.5 %   Platelets 174 150 - 400 K/uL   nRBC 0.0 0.0 - 0.2 %   Neutrophils Relative % 70 %   Neutro Abs 7.2 1.7 - 7.7 K/uL   Lymphocytes Relative 21 %    Lymphs Abs 2.1 0.7 - 4.0 K/uL   Monocytes Relative 7 %   Monocytes Absolute 0.7 0 - 1 K/uL   Eosinophils Relative 1 %   Eosinophils Absolute 0.1 0 - 0 K/uL   Basophils Relative 0 %   Basophils Absolute 0.0 0 - 0 K/uL   Immature Granulocytes 1 %   Abs Immature Granulocytes 0.05 0.00 - 0.07 K/uL    Comment: Performed at [facility], [address] [place], [place] [address]  Comprehensive metabolic panel     Status: Abnormal   Collection Time: 12/09/19 10:44 PM  Result Value Ref Range   Sodium 139 135 - 145 mmol/L   Potassium 3.4 (L) 3.5 - 5.1 mmol/L   Chloride 106 98 - 111 mmol/L   CO2 23 22 - 32 mmol/L   Glucose, Bld 128 (H) 70 - 99 mg/dL    Comment: Glucose reference range applies only to samples taken after fasting for at least 8 hours.   BUN 15 8 - 23 mg/dL   Creatinine, Ser 0.77 0.44 - 1.00 mg/dL   Calcium 8.6 (L) 8.9 - 10.3 mg/dL   Total Protein 6.1 (L) 6.5 - 8.1 g/dL   Albumin 3.8 3.5 - 5.0 g/dL  AST 23 15 - 41 U/L   ALT 14 0 - 44 U/L   Alkaline Phosphatase 52 38 - 126 U/L   Total Bilirubin 0.5 0.3 - 1.2 mg/dL   GFR calc non Af Amer >60 >60 mL/min   GFR calc Af Amer >60 >60 mL/min   Anion gap 10 5 - 15    Comment: Performed at [place] [address], [place], [place] [address]  SARS Coronavirus 2 by RT PCR (hospital order, performed in [facility] hospital lab) Nasopharyngeal Nasopharyngeal Swab     Status: None   Collection Time: 12/09/19 10:55 PM   Specimen: Nasopharyngeal Swab  Result Value Ref Range   SARS Coronavirus 2 NEGATIVE NEGATIVE    Comment: (NOTE) SARS-CoV-2 target nucleic acids are NOT DETECTED.  The SARS-CoV-2 RNA is generally detectable in upper and lower respiratory specimens during the acute phase of infection. The lowest concentration of SARS-CoV-2 viral copies this assay can detect is 250 copies / mL. A negative result does not preclude SARS-CoV-2 infection and should not be used as the sole basis for treatment or  other patient management decisions.  A negative result may occur with improper specimen collection / handling, submission of specimen other than nasopharyngeal swab, presence of viral mutation(s) within the areas targeted by this assay, and inadequate number of viral copies (<250 copies / mL). A negative result must be combined with clinical observations, patient history, and epidemiological information.  Fact Sheet for Patients:   StrictlyIdeas.no  Fact Sheet for Healthcare Providers: BankingDealers.co.za  This test is not yet approved or  cleared by the [place] FDA and has been authorized for detection and/or diagnosis of SARS-CoV-2 by FDA under an Emergency Use Authorization (EUA).  This EUA will remain in effect (meaning this test can be used) for the duration of the COVID-19 declaration under Section 564(b)(1) of the Act, 21 U.S.C. section 360bbb-3(b)(1), unless the authorization is terminated or revoked sooner.  Performed at [facility], [place] [address] [place], [place] [address]   Basic metabolic panel     Status: Abnormal   Collection Time: 12/10/19  4:06 AM  Result Value Ref Range   Sodium 138 135 - 145 mmol/L   Potassium 4.6 3.5 - 5.1 mmol/L   Chloride 102 98 - 111 mmol/L   CO2 27 22 - 32 mmol/L   Glucose, Bld 142 (H) 70 - 99 mg/dL    Comment: Glucose reference range applies only to samples taken after fasting for at least 8 hours.   BUN 13 8 - 23 mg/dL   Creatinine, Ser 0.68 0.44 - 1.00 mg/dL   Calcium 8.9 8.9 - 10.3 mg/dL   GFR calc non Af Amer >60 >60 mL/min   GFR calc Af Amer >60 >60 mL/min   Anion gap 9 5 - 15    Comment: Performed at [place] [address], [place], [place] [address]  CBC     Status: Abnormal   Collection Time: 12/10/19  4:06 AM  Result Value Ref Range   WBC 11.7 (H) 4.0 - 10.5 K/uL   RBC 4.11 3.87 - 5.11 MIL/uL   Hemoglobin 11.7 (L) 12.0 - 15.0 g/dL   HCT 37.9 36 -  46 %   MCV 92.2 80.0 - 100.0 fL   MCH 28.5 26.0 - 34.0 pg   MCHC 30.9 30.0 - 36.0 g/dL   RDW 13.6 11.5 - 15.5 %   Platelets 179 150 - 400 K/uL  nRBC 0.0 0.0 - 0.2 %    Comment: Performed at [facility], [place] [address] [place], [place] [address]    DG Chest Port 1 View  Result Date: 12/09/2019 CLINICAL DATA:  Pain status post fall EXAM: PORTABLE CHEST 1 VIEW COMPARISON:  None. FINDINGS: There is significant elevation of the right hemidiaphragm. No evidence for pneumothorax. No acute displaced fracture. The heart size is unremarkable. There are atherosclerotic changes of the thoracic aorta. IMPRESSION: 1. No acute cardiopulmonary process. 2. Significant elevation of the right hemidiaphragm. Electronically Signed   By: Constance Holster M.D.   On: 12/09/2019 23:10   DG FEMUR PORT 1V LEFT  Result Date: 12/09/2019 CLINICAL DATA:  Trauma, fell EXAM: LEFT FEMUR PORTABLE 1 VIEW COMPARISON:  None. FINDINGS: Three frontal views of the left femur are obtained. There is a mildly comminuted oblique proximal left femoral diaphyseal fracture with varus angulation at the fracture site. No evidence of hip dislocation. Distal femur is unremarkable. IMPRESSION: 1. Mildly comminuted oblique proximal left femoral diaphyseal fracture. Electronically Signed   By: Randa Ngo M.D.   On: 12/09/2019 23:10    Review of Systems  All other systems reviewed and are negative.  Blood pressure 114/70, pulse 65, temperature 97.9 F (36.6 C), temperature source Oral, resp. rate 13, height 5\' 2"  (1.575 m), weight 68 kg, SpO2 99 %. Physical Exam Vitals reviewed.  Constitutional:      Appearance: Normal appearance.  HENT:     Head: Normocephalic and atraumatic.  Eyes:     Extraocular Movements: Extraocular movements intact.  Cardiovascular:     Rate and Rhythm: Normal rate.  Pulmonary:     Effort: Pulmonary effort is normal.  Abdominal:     Palpations: Abdomen is soft.  Musculoskeletal:     Cervical  back: Normal range of motion.     Left hip: Deformity, tenderness and bony tenderness present. Decreased range of motion.  Neurological:     Mental Status: She is alert and oriented to person, place, and time.  Psychiatric:        Behavior: Behavior normal.     Assessment/Plan: Left proximal femur subtrochanteric fracture  The patient understands fully that we need to proceed to surgery today to stabilize the femur fracture.  This will be accomplished with an intramedullary nail.  The goal will be in the hopefully later today or tomorrow for mobility and potentially weightbearing as tolerated.  I explained in detail what the surgery involves including the risk and benefits of surgery and informed consent has been obtained.  Surgery will be later this morning.  Mcarthur Rossetti 12/10/2019, 7:44 AM

## 2019-12-10 NOTE — H&P (Signed)
History and Physical    Savannah Dean AVW:[id number] DOB: 22-Nov-1945 DOA: 12/09/2019  PCP: System, Pcp Not In   Patient coming from: Home  Chief Complaint: Left hip pain after fall at home. Unable to get up  HPI: Savannah Dean is a 74 y.o. female with medical history significant for hypertension, arthritis and scoliosis who presents by EMS after a fall at home which is resulted in severe left hip pain.  Patient was walking in her house when she lost her balance and tripped when she bumped into a stepping stool and fell to the ground.  She landed on her left side.  She had severe left hip pain as soon as she fell and she was unable to stand up.  She was able to scoot a few feet across the floor to her phone and call 911 for help.  She denies any head injury and did not have any loss of consciousness.  After she fell she did have one episode of nausea with small volume of emesis secondary to the pain.  She has no nausea at this time.  She denies having any chest pain or palpitations prior to the fall.  She did not have any visual change.  With her scoliosis and arthritis she has poor balance and she thinks this contributed to her fall. She lives alone.  Denies tobacco, alcohol, illicit drug use  ED Course: Urgency room patient is found to have a shortened internally rotated left leg with a closed left hip fracture on x-ray.  Orthopedics was called and requested hospitalist to admit and they will see patient in the morning to plan for definitive repair of hip  Review of Systems:  General: Denies fever, chills, weight loss, night sweats.  Denies dizziness.  Denies change in appetite HENT: Denies head trauma, headache, denies change in hearing, tinnitus.  Denies nasal congestion or bleeding.  Denies sore throat, sores in mouth.  Denies difficulty swallowing Eyes: Denies blurry vision, pain in eye, drainage.  Denies discoloration of eyes. Neck: Denies Pain.  Denies swelling.  Denies pain with  movement. Cardiovascular: Denies chest pain, palpitations.  Denies edema.  Denies orthopnea Respiratory: Denies shortness of breath, cough.  Denies wheezing.  Denies sputum production Gastrointestinal: Denies abdominal pain, swelling.  Denies nausea, vomiting, diarrhea.  Denies melena.  Denies hematemesis. Musculoskeletal: Reports left hip pain with limitation of movement of her left leg due to pain.  Left leg is internally rotated.  Denies pain.  Has chronic arthralgias.  Denies myalgias. Genitourinary: Denies pelvic pain.  Denies urinary frequency or hesitancy.  Denies dysuria.  Skin: Denies rash.  Denies petechiae, purpura, ecchymosis. Neurological: Denies headache.  Denies syncope.  Denies seizure activity.  Denies weakness or paresthesia.  Slurred speech, drooping face.  Denies visual change. Psychiatric: Denies depression, anxiety.  Denies suicidal thoughts or ideation.  Denies hallucinations.  Past Medical History:  Diagnosis Date  . Arthritis   . Hypertension     Past Surgical History:  Procedure Laterality Date  . NO PAST SURGERIES      Social History  reports that she has never smoked. She has never used smokeless tobacco. She reports that she does not drink alcohol and does not use drugs.  No Known Allergies  History reviewed. No pertinent family history.   Prior to Admission medications   Medication Sig Start Date End Date Taking? Authorizing Provider  alendronate (FOSAMAX) 70 MG tablet Take 70 mg by mouth every Saturday. 11/30/19  Yes [provider]  EPINEPHrine 0.3 mg/0.3 mL IJ SOAJ injection Inject 0.3 mg into the muscle as needed for anaphylaxis.  09/25/19  Yes [provider]  fluticasone (FLONASE) 50 MCG/ACT nasal spray Place 1 spray into both nostrils daily as needed for allergies.  08/16/19  Yes [provider]  gabapentin (NEURONTIN) 100 MG capsule Take 100 mg by mouth in the morning and at bedtime. 11/07/19  Yes [provider]    levocetirizine (XYZAL) 5 MG tablet Take 5 mg by mouth daily. 11/17/19  Yes [provider]  losartan (COZAAR) 25 MG tablet Take 25 mg by mouth daily. 11/10/19  Yes [provider]  meloxicam (MOBIC) 15 MG tablet Take 15 mg by mouth daily. 09/11/19  Yes [provider]  omeprazole (PRILOSEC) 20 MG capsule Take 20 mg by mouth daily. 11/07/19  Yes [provider]  PROAIR RESPICLICK 563 (90 Base) MCG/ACT AEPB Inhale 2 puffs into the lungs every 6 (six) hours as needed (for wheezing).  08/16/19  Yes [provider]  tiZANidine (ZANAFLEX) 4 MG tablet Take 4 mg by mouth 3 (three) times daily. 11/30/19  Yes [provider]    Physical Exam: Vitals:   12/09/19 2256 12/09/19 2321 12/09/19 2322 12/10/19 0003  BP:   139/68 (!) 155/78  Pulse: 75  76 75  Resp: 11  (!) 25 14  Temp:      TempSrc:      SpO2: 98% 99% 98% 95%  Weight:      Height:        Constitutional: NAD, calm, comfortable Vitals:   12/09/19 2256 12/09/19 2321 12/09/19 2322 12/10/19 0003  BP:   139/68 (!) 155/78  Pulse: 75  76 75  Resp: 11  (!) 25 14  Temp:      TempSrc:      SpO2: 98% 99% 98% 95%  Weight:      Height:       General: WDWN, Alert and oriented x3.  Eyes: EOMI, PERRL, lids and conjunctivae normal.  Sclera nonicteric HENT:  [place]/AT, external ears normal.  Nares patent without epistasis.  Mucous membranes are moist. Posterior pharynx clear of any exudate or lesions.  Dentures in place Neck: Soft, normal range of motion, supple, no masses, no thyromegaly.  Trachea midline Respiratory: clear to auscultation bilaterally, no wheezing, no crackles. Normal respiratory effort. No accessory muscle use.  Cardiovascular: Regular rate and rhythm, no murmurs / rubs / gallops. No extremity edema. 2+ pedal pulses laterally.   Abdomen: Soft, no tenderness, nondistended, no rebound or guarding.  No masses palpated. Bowel sounds normoactive Musculoskeletal: FROM of upper extremities  and right lower extremity.  Left lower extremity shortened and renally rotated.  Left hip tender abrasion anterior and laterally. no clubbing / cyanosis. Normal muscle tone.  Skin: Warm, dry, intact no rashes, lesions, ulcers. No induration Neurologic: CN 2-12 grossly intact.  Normal speech.  Sensation intact, grip strength 5 out of 5 bilaterally.   Psychiatric: Normal judgment and insight.  Normal mood.    Labs on Admission: I have personally reviewed following labs and imaging studies  CBC: Recent Labs  Lab 12/09/19 2244  WBC 10.2  NEUTROABS 7.2  HGB 11.6*  HCT 37.3  MCV 90.3  PLT 875    Basic Metabolic Panel: Recent Labs  Lab 12/09/19 2244  NA 139  K 3.4*  CL 106  CO2 23  GLUCOSE 128*  BUN 15  CREATININE 0.77  CALCIUM 8.6*    GFR: Estimated  Creatinine Clearance: 55 mL/min (by C-G formula based on SCr of 0.77 mg/dL).  Liver Function Tests: Recent Labs  Lab 12/09/19 2244  AST 23  ALT 14  ALKPHOS 52  BILITOT 0.5  PROT 6.1*  ALBUMIN 3.8    Urine analysis: No results found for: COLORURINE, APPEARANCEUR, LABSPEC, PHURINE, GLUCOSEU, HGBUR, BILIRUBINUR, KETONESUR, PROTEINUR, UROBILINOGEN, NITRITE, LEUKOCYTESUR  Radiological Exams on Admission: DG Chest Port 1 View  Result Date: 12/09/2019 CLINICAL DATA:  Pain status post fall EXAM: PORTABLE CHEST 1 VIEW COMPARISON:  None. FINDINGS: There is significant elevation of the right hemidiaphragm. No evidence for pneumothorax. No acute displaced fracture. The heart size is unremarkable. There are atherosclerotic changes of the thoracic aorta. IMPRESSION: 1. No acute cardiopulmonary process. 2. Significant elevation of the right hemidiaphragm. Electronically Signed   By: Constance Holster M.D.   On: 12/09/2019 23:10   DG FEMUR PORT 1V LEFT  Result Date: 12/09/2019 CLINICAL DATA:  Trauma, fell EXAM: LEFT FEMUR PORTABLE 1 VIEW COMPARISON:  None. FINDINGS: Three frontal views of the left femur are obtained. There is a  mildly comminuted oblique proximal left femoral diaphyseal fracture with varus angulation at the fracture site. No evidence of hip dislocation. Distal femur is unremarkable. IMPRESSION: 1. Mildly comminuted oblique proximal left femoral diaphyseal fracture. Electronically Signed   By: Randa Ngo M.D.   On: 12/09/2019 23:10    EKG: Independently reviewed.  EKG shows normal sinus rhythm with occasional PVC.  Nonspecific ST changes but no acute ST elevation or depression.  QTc 496 which is mildly prolonged  Assessment/Plan Principal Problem:   Closed left hip fracture (HCC) Admit to MedSurg floor. Orthopedic surgery has been consulted and will see patient in the morning and plan for definitive surgical repair of hip fracture Pain control with Dilaudid overnight. Bedrest overnight. Consult PT after orthopedic evaluation to plan for rehab needs  Active Problems:   Essential hypertension Continue home medication.  Monitor blood pressure    Fall at home, initial encounter PT evaluation    DVT prophylaxis: SCDs for DVT prophylaxis.  Anticoagulation is held with anticipation of surgical repair of left hip fracture Code Status:   Full code Family Communication:  Diagnosis and plan discussed with patient.  Questions answered.  Verbalized understanding and agrees with plan.  Further recommendation to follow as clinical indicated  Disposition Plan:   Patient is from:  Home  Anticipated DC to:  SNF for rehab  Anticipated DC date:  Anticipate greater than 2 midnight stay in the hospital for medical needs  Anticipated DC barriers: No barriers to discharge identified   Consults called:  Orthopedic surgery Admission status:  Inpatient  Severity of Illness: The appropriate patient status for this patient is INPATIENT. Inpatient status is judged to be reasonable and necessary in order to provide the required intensity of service to ensure the patient's safety. The patient's presenting symptoms,  physical exam findings, and initial radiographic and laboratory data in the context of their chronic comorbidities is felt to place them at high risk for further clinical deterioration. Furthermore, it is not anticipated that the patient will be medically stable for discharge from the hospital within 2 midnights of admission. The following factors support the patient status of inpatient.   " The patient's presenting symptoms include her left hip pain and inability to stand or bear weight. " The worrisome physical exam findings include left hip pain with a shortened and internally rotated left leg. " The initial radiographic and laboratory data are  worrisome because of left hip fracture. " The chronic co-morbidities include history of hypertension that is controlled.  History of arthritis and scoliosis.   * I certify that at the point of admission it is my clinical judgment that the patient will require inpatient hospital care spanning beyond 2 midnights from the point of admission due to high intensity of service, high risk for further deterioration and high frequency of surveillance required.Yevonne Aline Kamarri Fischetti MD [facility]  How to contact the [facility] Attending or Consulting provider [place] or covering provider during after hours [place], for this patient?   1. Check the care team in [facility] and look for a) attending/consulting [facility] provider listed and b) the [facility] team listed 2. Log into [email or web address] and use [facility]'s universal password to access. If you do not have the password, please contact the hospital operator. 3. Locate the [facility] provider you are looking for under [facility] and page to a number that you can be directly reached. 4. If you still have difficulty reaching the provider, please page the [facility] (Director on Call) for the Hospitalists listed on amion for assistance.  12/10/2019, 12:44 AM

## 2019-12-11 ENCOUNTER — Encounter (HOSPITAL_COMMUNITY): Payer: Self-pay | Admitting: Orthopaedic Surgery

## 2019-12-11 DIAGNOSIS — D649 Anemia, unspecified: Secondary | ICD-10-CM

## 2019-12-11 DIAGNOSIS — I10 Essential (primary) hypertension: Secondary | ICD-10-CM

## 2019-12-11 LAB — CBC
HCT: 22.3 % — ABNORMAL LOW (ref 36.0–46.0)
Hemoglobin: 7 g/dL — ABNORMAL LOW (ref 12.0–15.0)
MCH: 29 pg (ref 26.0–34.0)
MCHC: 31.4 g/dL (ref 30.0–36.0)
MCV: 92.5 fL (ref 80.0–100.0)
Platelets: 152 10*3/uL (ref 150–400)
RBC: 2.41 MIL/uL — ABNORMAL LOW (ref 3.87–5.11)
RDW: 14 % (ref 11.5–15.5)
WBC: 9.7 10*3/uL (ref 4.0–10.5)
nRBC: 0 % (ref 0.0–0.2)

## 2019-12-11 LAB — BASIC METABOLIC PANEL
Anion gap: 12 (ref 5–15)
BUN: 19 mg/dL (ref 8–23)
CO2: 21 mmol/L — ABNORMAL LOW (ref 22–32)
Calcium: 8.1 mg/dL — ABNORMAL LOW (ref 8.9–10.3)
Chloride: 98 mmol/L (ref 98–111)
Creatinine, Ser: 1.11 mg/dL — ABNORMAL HIGH (ref 0.44–1.00)
GFR calc Af Amer: 56 mL/min — ABNORMAL LOW (ref >60)
GFR calc non Af Amer: 49 mL/min — ABNORMAL LOW (ref >60)
Glucose, Bld: 122 mg/dL — ABNORMAL HIGH (ref 70–99)
Potassium: 4.1 mmol/L (ref 3.5–5.1)
Sodium: 131 mmol/L — ABNORMAL LOW (ref 135–145)

## 2019-12-11 LAB — ABO/RH: ABO/RH(D): O POS

## 2019-12-11 LAB — PREPARE RBC (CROSSMATCH)

## 2019-12-11 MED ORDER — ADULT MULTIVITAMIN W/MINERALS CH
1.0000 | ORAL_TABLET | Freq: Every day | ORAL | Status: DC
  Administered 2019-12-12 – 2019-12-15 (×4): 1 via ORAL
  Filled 2019-12-11 (×4): qty 1

## 2019-12-11 MED ORDER — ASPIRIN 325 MG PO TBEC: 325.0000 mg | DELAYED_RELEASE_TABLET | Freq: Every day | ORAL | 0 refills | Status: DC

## 2019-12-11 MED ORDER — BOOST / RESOURCE BREEZE PO LIQD CUSTOM
1.0000 | Freq: Three times a day (TID) | ORAL | Status: DC
  Administered 2019-12-11 – 2019-12-15 (×8): 1 via ORAL

## 2019-12-11 MED ORDER — SODIUM CHLORIDE 0.9% IV SOLUTION: Freq: Once | INTRAVENOUS | Status: AC

## 2019-12-11 NOTE — Plan of Care (Signed)

## 2019-12-11 NOTE — Progress Notes (Signed)
Initial Nutrition Assessment  DOCUMENTATION CODES:   Not applicable  INTERVENTION:   - Boost Breeze po TID, each supplement provides 250 kcal and 9 grams of protein  - MVI with minerals daily  NUTRITION DIAGNOSIS:   Increased nutrient needs related to hip fracture, post-op healing as evidenced by estimated needs.  GOAL:   Patient will meet greater than or equal to 90% of their needs  MONITOR:   PO intake, Supplement acceptance, Labs, Weight trends, Skin, I & O's  REASON FOR ASSESSMENT:   Consult Hip fracture protocol  ASSESSMENT:   74 year old female who presented on 7/10 after a fall. PMH of HTN. Pt found to have a left subtrochanteric femur fracture.   7/11 - s/p IM nailing of left subtrochanteric femur fracture  Spoke with pt at bedside. RN and NT in room providing nursing care. Pt somewhat confused at time of RD visit, stating "my head is all messed up." Pt wondering if she is having trouble thinking clearly due to medications.  Pt reports that her appetite is not very good. Pt reports that for breakfast today she had 1 egg and 2 pieces of bacon. Pt states that at home, she gets up at 11:00 am and doesn't eat her first meal until 1:30 pm. Pt reports that she eats a lot of sweet potatoes. RD attempted to obtain more diet history from pt but pt was easily distracted.  Pt reports that she used to weigh 135 lbs but that she has gained weight over the last few years since stopping work and now weighs around 150 lbs. No weight history available in chart. Weight of 150 lbs on admission appears stated rather than measured.  Pt states that she did not want lunch because she was not hungry but RN provided her with graham crackers and orange juice. RD obtained and placed diner order for pt: meatloaf, sweet potato with butter, iced tea.  Pt reports that she does not tolerate milk products as they cause her to have more mucus. She is willing to try Boost Breeze supplements to aid in  meeting kcal and protein needs. RD will also order daily MVI with minerals.  Discussed with pt the importance of adequate PO intake in healing. Pt expresses understanding.  Meal Completion: 75%  Medications reviewed and include: colace  Labs reviewed: sodium 131, hemoglobin 7.0  NUTRITION - FOCUSED PHYSICAL EXAM:    Most Recent Value  Orbital Region Moderate depletion  Upper Arm Region No depletion  Thoracic and Lumbar Region No depletion  Buccal Region No depletion  Temple Region Mild depletion  Clavicle Bone Region Mild depletion  Clavicle and Acromion Bone Region Moderate depletion  Scapular Bone Region Mild depletion  Dorsal Hand Mild depletion  Patellar Region Mild depletion  Anterior Thigh Region Mild depletion  Posterior Calf Region Mild depletion  Edema (RD Assessment) Mild  [LLE]  Hair Reviewed  Eyes Reviewed  Mouth Reviewed  Skin Reviewed  Nails Reviewed       Diet Order:   Diet Order            Diet regular Room service appropriate? Yes; Fluid consistency: Thin  Diet effective now                 EDUCATION NEEDS:   Education needs have been addressed  Skin:  Skin Assessment: Skin Integrity Issues: Incisions: left hip  Last BM:  12/09/19  Height:   Ht Readings from Last 1 Encounters:  12/09/19 5\' 2"  (1.575 m)  Weight:   Wt Readings from Last 1 Encounters:  12/09/19 68 kg    Ideal Body Weight:  50 kg  BMI:  Body mass index is 27.44 kg/m.  Estimated Nutritional Needs:   Kcal:  1500-1700  Protein:  75-90 grams  Fluid:  1.5-1.7 L    Gaynell Face, MS, RD, LDN Inpatient Clinical Dietitian Please see AMiON for contact information.

## 2019-12-11 NOTE — Plan of Care (Signed)
  Problem: Education: Goal: Knowledge of General Education information will improve Description: Including pain rating scale, medication(s)/side effects and non-pharmacologic comfort measures Outcome: Progressing   Problem: Education: Goal: Knowledge of General Education information will improve Description: Including pain rating scale, medication(s)/side effects and non-pharmacologic comfort measures Outcome: Progressing   Problem: Education: Goal: Knowledge of General Education information will improve Description: Including pain rating scale, medication(s)/side effects and non-pharmacologic comfort measures Outcome: Progressing   Problem: Education: Goal: Knowledge of General Education information will improve Description: Including pain rating scale, medication(s)/side effects and non-pharmacologic comfort measures Outcome: Progressing   Problem: Education: Goal: Knowledge of General Education information will improve Description: Including pain rating scale, medication(s)/side effects and non-pharmacologic comfort measures Outcome: Progressing   

## 2019-12-11 NOTE — Progress Notes (Signed)
Patient ID: Savannah Dean, female   DOB: 1945-09-19, 74 y.o.   MRN: [id number] The patient tolerated surgery well on her left subtroches femur fracture yesterday.  She is sitting up in bed eating this morning.  I explained to her what the surgery involves.  The therapy process will start today with working on her mobility.  She does live at home alone and may benefit from short-term skilled nursing.  We will see what therapy's recommendations are.  A CBC and BMET has been ordered.  The dressings on her left thigh are clean and dry and intact.

## 2019-12-11 NOTE — Progress Notes (Signed)
Critical hemogolin result received from Lab. Notified MD. Blood Crossmatch and 2 units ordered.

## 2019-12-11 NOTE — Progress Notes (Signed)
Patients educated on sign and symptom of blood reaction. Verbalizing understand.

## 2019-12-11 NOTE — Evaluation (Signed)
Occupational Therapy Evaluation Patient Details Name: Savannah Dean MRN: [id number] DOB: [birth year] Today's Date: 12/11/2019    History of Present Illness Savannah Dean is a 74 yo female presenting after a mechanical fall at home resulting in L subtrochanteric femur fx who is now s/p IM nail placement 7/11. PMH includes: HTN, arthritis, and scoliosis.    Clinical Impression   PTA pt reports being independent, living in an apartment alone, and driving. Pt was admitted for above and treated for problem list below (see OT Problem List). Requires Min Guard - Total A with ADLs due to weakness, pain, and deficits in balance. Requires Mod A with transfers and bed mobility with verbal cues for sequencing and safety. Reports she would like to return to her home and can get PRN support. Therapist discussed importance of 24/7 support/assist for safety and option of short-term SNF placement. Pt suggested she may try and obtain 24/7 support to go home. Believe pt would benefit from skilled OT services acutely and at the SNF level to increase strength and independence with ADLs. IF pt obtains 24/7 support at home and progresses, would recommend HHOT with 24/7 support/assist.    Follow Up Recommendations  SNF;Supervision/Assistance - 24 hour    Equipment Recommendations  3 in 1 bedside commode       Precautions / Restrictions Precautions Precautions: Fall Restrictions Weight Bearing Restrictions: Yes LLE Weight Bearing: Weight bearing as tolerated      Mobility Bed Mobility Overal bed mobility: Needs Assistance Bed Mobility: Supine to Sit     Supine to sit: HOB elevated;Mod assist     General bed mobility comments: Mod A with HOB elevated  Transfers Overall transfer level: Needs assistance Equipment used: Rolling walker (2 wheeled) Transfers: Sit to/from [facility] Sit to Stand: Mod assist;From elevated surface Stand pivot transfers: Mod assist       General  transfer comment: Mod A for lift and steady with steps for stand pivot transfer    Balance Overall balance assessment: Needs assistance Sitting-balance support: Single extremity supported;Feet supported Sitting balance-Leahy Scale: Fair Sitting balance - Comments: pt holding on to bed rail with L hand with L lean initially to min guard  Postural control: Left lateral lean Standing balance support: Bilateral upper extremity supported Standing balance-Leahy Scale: Poor Standing balance comment: reliant on BUE support                           ADL either performed or assessed with clinical judgement   ADL Overall ADL's : Needs assistance/impaired     Grooming: Sitting;Min guard Grooming Details (indicate cue type and reason): Min guard for sitting balance Upper Body Bathing: Minimal assistance;Sitting Upper Body Bathing Details (indicate cue type and reason): Min A in sitting for balance  Lower Body Bathing: Moderate assistance;Sit to/from stand Lower Body Bathing Details (indicate cue type and reason): mod A with sit<>stand Upper Body Dressing : Min guard;Sitting Upper Body Dressing Details (indicate cue type and reason): min guard for a sitting balance Lower Body Dressing: Maximal assistance;Sit to/from stand Lower Body Dressing Details (indicate cue type and reason): Max A for assist with sit<>stand with pulling up clothing Toilet Transfer: Moderate assistance;BSC;Stand-pivot Toilet Transfer Details (indicate cue type and reason): simulated to recliner Toileting- Clothing Manipulation and Hygiene: Total assistance;Sit to/from stand Toileting - Clothing Manipulation Details (indicate cue type and reason): relying on BUE support   Tub/Shower Transfer Details (indicate cue type and reason): deferred due  to pt safety Functional mobility during ADLs: Rolling walker;Moderate assistance;Cueing for safety General ADL Comments: presenting with weakness, pain, and decreased safety  awareness/awareness of deficits                  Pertinent Vitals/Pain Pain Assessment: No/denies pain     Hand Dominance Right   Extremity/Trunk Assessment Upper Extremity Assessment Upper Extremity Assessment: Generalized weakness   Lower Extremity Assessment Lower Extremity Assessment: Defer to PT evaluation   Cervical / Trunk Assessment Cervical / Trunk Assessment: Normal   Communication Communication Communication: HOH   Cognition Arousal/Alertness: Awake/alert Behavior During Therapy: WFL for tasks assessed/performed Overall Cognitive Status: Impaired/Different from baseline Area of Impairment: Attention;Safety/judgement;Problem solving;Awareness                   Current Attention Level: Sustained     Safety/Judgement: Decreased awareness of safety;Decreased awareness of deficits Awareness: Emergent Problem Solving: Difficulty sequencing;Requires verbal cues;Requires tactile cues General Comments: decreased awareness of safety and deficits history of falls    General Comments  Pt reports she would like to go home. Therapist discussed if she found 24/7 assist and support she could go home, but would prefer short-term SNF stay to get stronger to be safer going home            Home Living Family/patient expects to be discharged to:: Private residence Living Arrangements: Alone Available Help at Discharge: Family;Friend(s);Available PRN/intermittently Type of Home: Apartment Home Access: Elevator     Home Layout: One level     Bathroom Shower/Tub: Teacher, early years/pre: Standard     Home Equipment: Cane - single point          Prior Functioning/Environment Level of Independence: Independent with assistive device(s)        Comments: driving and living on her own        OT Problem List: Decreased strength;Decreased range of motion;Decreased activity tolerance;Impaired balance (sitting and/or standing);Decreased  coordination;Decreased safety awareness;Decreased knowledge of use of DME or AE;Decreased knowledge of precautions;Pain      OT Treatment/Interventions: Self-care/ADL training;Therapeutic exercise;Energy conservation;DME and/or AE instruction;Therapeutic activities;Patient/family education;Balance training    OT Goals(Current goals can be found in the care plan section) Acute Rehab OT Goals Patient Stated Goal: go home OT Goal Formulation: With patient Time For Goal Achievement: 12/25/19 Potential to Achieve Goals: Good  OT Frequency: Min 2X/week   Barriers to D/C: Decreased caregiver support  PRN support          AM-PAC OT "6 Clicks" Daily Activity     Outcome Measure Help from another person eating meals?: None Help from another person taking care of personal grooming?: A Little Help from another person toileting, which includes using toliet, bedpan, or urinal?: Total Help from another person bathing (including washing, rinsing, drying)?: A Lot Help from another person to put on and taking off regular upper body clothing?: A Little Help from another person to put on and taking off regular lower body clothing?: A Lot 6 Click Score: 15   End of Session Equipment Utilized During Treatment: Gait belt;Rolling walker Nurse Communication: Mobility status  Activity Tolerance: Patient limited by fatigue Patient left: in chair;with call bell/phone within reach;with chair alarm set  OT Visit Diagnosis: Unsteadiness on feet (R26.81);Other abnormalities of gait and mobility (R26.89);Muscle weakness (generalized) (M62.81);History of falling (Z91.81);Pain Pain - Right/Left: Left Pain - part of body: Leg;Hip  Time: 2878-6767 OT Time Calculation (min): 47 min Charges:  OT General Charges $OT Visit: 1 Visit OT Evaluation $OT Eval Moderate Complexity: 1 Mod OT Treatments $Self Care/Home Management : 23-37 mins  Tysheem Accardo/OTS  Deshaun Weisinger 12/11/2019, 10:48 AM

## 2019-12-11 NOTE — Progress Notes (Signed)
[facility]  PROGRESS NOTE  Savannah Dean DTO:[id number] DOB: April 16, 1946 DOA: 12/09/2019 PCP: System, Pcp Not In   Brief HPI:    74 year old female with history of hypertension, arthritis, scoliosis who was brought by EMS after fall at home which resulted in severe left hip pain.  X-ray showed left hip fracture.  Orthopedics was consulted.  Patient underwent intramedullary nail for subtrochanteric femur fracture.    Subjective   Patient seen and examined, feels fatigued this morning.  Hemoglobin came back 7.0.  2 units of PRBC ordered.  Pain well controlled.   Assessment/Plan:     1. S/p intramedullary nail for subtrochanteric left femur fracture-stable, orthopedics following. 2. Anemia due to acute blood loss-hemoglobin dropped to 7.0 from 11.7 yesterday.  Will order 2 units of PRBC.  Follow CBC in a.m. 3. Hypertension-blood pressure is well controlled, continue losartan 25 mg daily.   Scheduled medications:   . sodium chloride   Intravenous Once  . aspirin EC  325 mg Oral Q breakfast  . docusate sodium  100 mg Oral BID  . feeding supplement  1 Container Oral TID BM  . gabapentin  100 mg Oral BID  . losartan  25 mg Oral Daily  . [START ON 12/12/2019] multivitamin with minerals  1 tablet Oral Daily  . tiZANidine  4 mg Oral TID     Antibiotics:   Anti-infectives (From admission, onward)   Start     Dose/Rate Route Frequency Ordered Stop   12/10/19 1530  ceFAZolin (ANCEF) IVPB 2g/100 mL premix        2 g 200 mL/hr over 30 Minutes Intravenous Every 6 hours 12/10/19 1336 12/10/19 2143   12/10/19 0858  ceFAZolin (ANCEF) 2-4 GM/100ML-% IVPB       Note to Pharmacy: Laurita Quint   : cabinet override      12/10/19 0858 12/10/19 2143   12/10/19 0845  ceFAZolin (ANCEF) IVPB 2g/100 mL premix        2 g 200 mL/hr over 30 Minutes Intravenous  Once 12/10/19 0839 12/10/19 0930      CBG: No results for input(s): GLUCAP in the last 168 hours.  SpO2: (P) 98 % O2 Flow  Rate (L/min): 2 L/min FiO2 (%): 28 %    CBC: Recent Labs  Lab 12/09/19 2244 12/10/19 0406 12/11/19 1256  WBC 10.2 11.7* 9.7  NEUTROABS 7.2  --   --   HGB 11.6* 11.7* 7.0*  HCT 37.3 37.9 22.3*  MCV 90.3 92.2 92.5  PLT 174 179 983    Basic Metabolic Panel: Recent Labs  Lab 12/09/19 2244 12/10/19 0406 12/11/19 1100  NA 139 138 131*  K 3.4* 4.6 4.1  CL 106 102 98  CO2 23 27 21*  GLUCOSE 128* 142* 122*  BUN 15 13 19   CREATININE 0.77 0.68 1.11*  CALCIUM 8.6* 8.9 8.1*     Liver Function Tests: Recent Labs  Lab 12/09/19 2244  AST 23  ALT 14  ALKPHOS 52  BILITOT 0.5  PROT 6.1*  ALBUMIN 3.8      DVT prophylaxis: Per orthopedics  Code Status: Full code  Family Communication: No family at bedside    Status is: Inpatient  Dispo: The patient is from: Home              Anticipated d/c is to: Likely skilled nursing facility              Anticipated d/c date is: 12/13/2019  Patient currently not medically stable for discharge  Barrier to discharge-getting blood transfusion for anemia          Consultants:  Orthopedics  Procedures:  S/p intramedullary nail for subtrochanteric left femur fracture   Objective   Vitals:   12/11/19 0903 12/11/19 1430 12/11/19 1600 12/11/19 1630  BP: (!) 111/55 112/62 (!) (P) 93/51 (!) (P) 97/50  Pulse: 78 82 (P) 70 (P) 71  Resp: 16 17 (P) 18 (P) 18  Temp: 98.5 F (36.9 C) 98.4 F (36.9 C) (P) 98.8 F (37.1 C) (P) 98.6 F (37 C)  TempSrc: Oral Oral (P) Oral (P) Oral  SpO2: 98% 98% (P) 96% (P) 98%  Weight:      Height:        Intake/Output Summary (Last 24 hours) at 12/11/2019 1643 Last data filed at 12/11/2019 1600 Gross per 24 hour  Intake 920 ml  Output 300 ml  Net 620 ml    07/10 1901 - 07/12 0700 In: 550 [I.V.:550] Out: 450 [Urine:300]  Filed Weights   12/09/19 2242  Weight: 68 kg    Physical Examination:    General: Appears in no acute distress  Cardiovascular: S1-S2,  regular, no murmur auscultated  Respiratory: Clear to auscultation bilaterally, no wheezing or crackles auscultated  Abdomen: Abdomen is soft, nontender, no organomegaly  Extremities: No edema in the lower extremities  Neurologic: Alert, oriented x3, intact insight and judgment    Data Reviewed:   Recent Results (from the past 240 hour(s))  SARS Coronavirus 2 by RT PCR (hospital order, performed in [facility] hospital lab) Nasopharyngeal Nasopharyngeal Swab     Status: None   Collection Time: 12/09/19 10:55 PM   Specimen: Nasopharyngeal Swab  Result Value Ref Range Status   SARS Coronavirus 2 NEGATIVE NEGATIVE Final    Comment: (NOTE) SARS-CoV-2 target nucleic acids are NOT DETECTED.  The SARS-CoV-2 RNA is generally detectable in upper and lower respiratory specimens during the acute phase of infection. The lowest concentration of SARS-CoV-2 viral copies this assay can detect is 250 copies / mL. A negative result does not preclude SARS-CoV-2 infection and should not be used as the sole basis for treatment or other patient management decisions.  A negative result may occur with improper specimen collection / handling, submission of specimen other than nasopharyngeal swab, presence of viral mutation(s) within the areas targeted by this assay, and inadequate number of viral copies (<250 copies / mL). A negative result must be combined with clinical observations, patient history, and epidemiological information.  Fact Sheet for Patients:   StrictlyIdeas.no  Fact Sheet for Healthcare Providers: BankingDealers.co.za  This test is not yet approved or  cleared by the [place] FDA and has been authorized for detection and/or diagnosis of SARS-CoV-2 by FDA under an Emergency Use Authorization (EUA).  This EUA will remain in effect (meaning this test can be used) for the duration of the COVID-19 declaration under Section 564(b)(1)  of the Act, 21 U.S.C. section 360bbb-3(b)(1), unless the authorization is terminated or revoked sooner.  Performed at [facility], [place] [address] [place], [place] [address]     No results for input(s): LIPASE, AMYLASE in the last 168 hours. No results for input(s): AMMONIA in the last 168 hours.  Cardiac Enzymes: No results for input(s): CKTOTAL, CKMB, CKMBINDEX, TROPONINI in the last 168 hours. BNP (last 3 results) No results for input(s): BNP in the last 8760 hours.  ProBNP (last 3 results) No results for input(s): PROBNP  in the last 8760 hours.  Studies:  DG Chest Port 1 View  Result Date: 12/09/2019 CLINICAL DATA:  Pain status post fall EXAM: PORTABLE CHEST 1 VIEW COMPARISON:  None. FINDINGS: There is significant elevation of the right hemidiaphragm. No evidence for pneumothorax. No acute displaced fracture. The heart size is unremarkable. There are atherosclerotic changes of the thoracic aorta. IMPRESSION: 1. No acute cardiopulmonary process. 2. Significant elevation of the right hemidiaphragm. Electronically Signed   By: Constance Holster M.D.   On: 12/09/2019 23:10   DG C-Arm 1-60 Min  Result Date: 12/10/2019 CLINICAL DATA:  Femur fracture repair. EXAM: DG C-ARM 1-60 MIN FLUOROSCOPY TIME:  Fluoroscopy Time:  3 minutes and 24 seconds Number of Acquired Spot Images: 8 COMPARISON:  None. FINDINGS: A gamma nail has been placed through the femoral neck. An intramedullary rod has been placed through the femur, crossing the fracture site. A distal interlocking screw is identified. No other acute abnormalities. IMPRESSION: Femoral fracture repair with a gamma nail and intramedullary femoral rod as above. Electronically Signed   By: Dorise Bullion III M.D   On: 12/10/2019 13:23   DG FEMUR PORT 1V LEFT  Result Date: 12/09/2019 CLINICAL DATA:  Trauma, fell EXAM: LEFT FEMUR PORTABLE 1 VIEW COMPARISON:  None. FINDINGS: Three frontal views of the left femur are obtained. There is a  mildly comminuted oblique proximal left femoral diaphyseal fracture with varus angulation at the fracture site. No evidence of hip dislocation. Distal femur is unremarkable. IMPRESSION: 1. Mildly comminuted oblique proximal left femoral diaphyseal fracture. Electronically Signed   By: Randa Ngo M.D.   On: 12/09/2019 23:10   DG FEMUR MIN 2 VIEWS LEFT  Result Date: 12/10/2019 CLINICAL DATA:  Femoral fracture repair. EXAM: LEFT FEMUR 2 VIEWS COMPARISON:  None. FINDINGS: Throughout the study, a femoral fracture has been repaired with a gamma nail and intramedullary femoral rod. IMPRESSION: Fracture repair as above. Electronically Signed   By: Dorise Bullion III M.D   On: 12/10/2019 13:24       Savannah Dean   [facility] If 7PM-7AM, please contact night-coverage at [email or web address], Office  [phone]   12/11/2019, 4:43 PM  LOS: 1 day

## 2019-12-11 NOTE — Progress Notes (Signed)
Slept well throughout the night. V/S stable, periods of hypotension, no complaints.No signs of distress. Remains alert and responsive,. Voided x 2 this shift. Pain is controlled with current regimen. Will cont. To monitor.

## 2019-12-11 NOTE — Evaluation (Signed)
Physical Therapy Evaluation Patient Details Name: Savannah Dean MRN: [id number] DOB: Nov 10, 1945 Today's Date: 12/11/2019   History of Present Illness  Kenneth Lax is a 74 yo female presenting after a mechanical fall at home resulting in L subtrochanteric femur fx who is now s/p IM nail placement 7/11. PMH includes: HTN, arthritis, and scoliosis.   Clinical Impression  Pt in bed upon arrival of PT, agreeable to evaluation at this time. Prior to admission the pt was completely independent with use of cane for mobility, but was still driving and able to complete all ADLs and IADLs without assist. The pt now presents with limitations in functional mobility, power, endurance, strength, stability, and activity tolerance due to above dx, and will continue to benefit from skilled PT to address these deficits. The pt was limited to a few sit-stand transfers and short attempt at ambulation today due to onset of weakness, clamminess, and fatigue after a few steps. To facilitate return to prior level of independence, the pt will continue to benefit from skilled PT acutely and following d/c to progress functional strength, stability, and endurance.      Follow Up Recommendations SNF;Supervision/Assistance - 24 hour    Equipment Recommendations   (defer to post acute)    Recommendations for Other Services       Precautions / Restrictions Precautions Precautions: Fall Precaution Comments: pt reports falling 1x/week at her home Restrictions Weight Bearing Restrictions: Yes LLE Weight Bearing: Weight bearing as tolerated      Mobility  Bed Mobility Overal bed mobility: Needs Assistance Bed Mobility: Supine to Sit     Supine to sit: HOB elevated;Mod assist     General bed mobility comments: pt OOB in recliner upon arrival  Transfers Overall transfer level: Needs assistance Equipment used: Rolling walker (2 wheeled) Transfers: Sit to/from Stand Sit to Stand: Mod assist Stand pivot  transfers: Mod assist       General transfer comment: modA to power up, VC for hand placement and sig extra time to complete. Pt standing with sig trunk flexion despite continued cues  Ambulation/Gait Ambulation/Gait assistance: Mod assist Gait Distance (Feet): 3 Feet Assistive device: Rolling walker (2 wheeled) Gait Pattern/deviations: Step-to pattern;Decreased stride length;Shuffle;Trunk flexed;Narrow base of support Gait velocity: decreased Gait velocity interpretation: <1.31 ft/sec, indicative of household ambulator General Gait Details: pt was only able to take a few small steps before reporting she feels too weak and tired to continue. small shuffle steps with minimal clearance. pt with clammy appearance, sweating. HR 70-80 with SpO2 88-90 on RA, 98% on 1L        Balance Overall balance assessment: Needs assistance Sitting-balance support: Single extremity supported;Feet supported Sitting balance-Leahy Scale: Fair Sitting balance - Comments: pt frequently leaning back against recliner due to reports of fatigue Postural control: Left lateral lean Standing balance support: Bilateral upper extremity supported Standing balance-Leahy Scale: Poor Standing balance comment: reliant on BUE support                             Pertinent Vitals/Pain Pain Assessment: No/denies pain    Home Living Family/patient expects to be discharged to:: Private residence Living Arrangements: Alone Available Help at Discharge: Family;Friend(s);Available PRN/intermittently Type of Home: Apartment Home Access: Elevator (level entry with longer walk)     Home Layout: One level Home Equipment: Cane - single point;Grab bars - tub/shower;Grab bars - toilet      Prior Function Level of Independence: Independent with  assistive device(s)         Comments: pt reports independence with all mobility, driving and performing ADLs independently. uses cane     Hand Dominance   Dominant  Hand: Right    Extremity/Trunk Assessment   Upper Extremity Assessment Upper Extremity Assessment: Generalized weakness    Lower Extremity Assessment Lower Extremity Assessment: Generalized weakness    Cervical / Trunk Assessment Cervical / Trunk Assessment: Normal  Communication   Communication: HOH  Cognition Arousal/Alertness: Awake/alert Behavior During Therapy: WFL for tasks assessed/performed Overall Cognitive Status: Within Functional Limits for tasks assessed Area of Impairment: Attention;Safety/judgement;Problem solving;Awareness                   Current Attention Level: Sustained     Safety/Judgement: Decreased awareness of safety;Decreased awareness of deficits Awareness: Emergent Problem Solving: Difficulty sequencing;Requires verbal cues;Requires tactile cues General Comments: decreased awareness of safety and deficits history of falls. benefits from reminders of goals and redirection      General Comments General comments (skin integrity, edema, etc.): Pt reports feeling very weak and clammy, progressed malaise through session    Exercises     Assessment/Plan    PT Assessment Patient needs continued PT services  PT Problem List Decreased strength;Decreased mobility;Decreased safety awareness;Decreased range of motion;Decreased activity tolerance;Decreased balance;Decreased knowledge of use of DME       PT Treatment Interventions DME instruction;Therapeutic exercise;Gait training;Stair training;Functional mobility training;Therapeutic activities;Patient/family education;Balance training    PT Goals (Current goals can be found in the Care Plan section)  Acute Rehab PT Goals Patient Stated Goal: go home PT Goal Formulation: With patient Time For Goal Achievement: 12/25/19 Potential to Achieve Goals: Good    Frequency Min 3X/week   Barriers to discharge Decreased caregiver support         AM-PAC PT "6 Clicks" Mobility  Outcome Measure  Help needed turning from your back to your side while in a flat bed without using bedrails?: A Little Help needed moving from lying on your back to sitting on the side of a flat bed without using bedrails?: A Little Help needed moving to and from a bed to a chair (including a wheelchair)?: A Lot Help needed standing up from a chair using your arms (e.g., wheelchair or bedside chair)?: A Little Help needed to walk in hospital room?: A Lot Help needed climbing 3-5 steps with a railing? : Total 6 Click Score: 14    End of Session Equipment Utilized During Treatment: Gait belt;Oxygen Activity Tolerance: Patient limited by fatigue Patient left: in chair;with chair alarm set;with call bell/phone within reach Nurse Communication: Mobility status PT Visit Diagnosis: Difficulty in walking, not elsewhere classified (R26.2);Muscle weakness (generalized) (M62.81)    Time: 7510-2585 PT Time Calculation (min) (ACUTE ONLY): 30 min   Charges:   PT Evaluation $PT Eval Moderate Complexity: 1 Mod PT Treatments $Gait Training: 8-22 mins        Karma Ganja, PT, DPT   Acute Rehabilitation Department Pager #: [phone]  Otho Bellows 12/11/2019, 12:49 PM

## 2019-12-11 NOTE — Discharge Instructions (Signed)
You may attempt to put all of your weight on your left lower extremity as comfort allows. You should only be up with assistance using a walker. The dressings can stay on for least 1 to 2 weeks unless they become saturated and into be changed with new dry dressing. The current dressings can get wet in the shower daily.

## 2019-12-11 NOTE — Op Note (Signed)
NAME: Savannah Dean, TATES MEDICAL RECORD XA:[id number] ACCOUNT [id number] DATE OF BIRTH:[birth year] FACILITY: [facility] LOCATION: [facility] PHYSICIAN:Tarissa Kerin Kerry Fort, MD  OPERATIVE REPORT  DATE OF PROCEDURE:  12/10/2019  PREOPERATIVE DIAGNOSIS:  Left subtrochanteric femur fracture.  POSTOPERATIVE DIAGNOSIS:  Left subtrochanteric femur fracture.  PROCEDURE:  Intramedullary nail placement, left femur with an 11 x ____ intramedullary nail and a 95 mm lag screw, as well as 1 distal interlocking screw.  SURGEON:  Lind Guest. Ninfa Linden, MD  ASSISTANT:  Erskine Emery, PA-C  ANESTHESIA:  General.  ANTIBIOTICS:  Two g IV Ancef.  ESTIMATED BLOOD LOSS:  150 mL.  COMPLICATIONS:  None.  INDICATIONS:  The patient is a 74 year old female who sustained an accidental mechanical fall yesterday, landing on her left hip.  She was transported to the [facility] emergency room after severe left hip pain and inability to ambulate.  She was found  to have a subtrochanteric femur fracture.  She is on Fosamax and I did not ask her about previous left hip pain, but she had been having some falls.  She was admitted to the medicine service and cleared for surgery for today.  I had a long and thorough  discussion about this surgery and describing what the surgery would involve, as well as a thorough discussion of risks and benefits of surgery.  DESCRIPTION OF PROCEDURE:  After informed consent was obtained, the appropriate left hip was marked.  She was brought to the operating room.  General anesthesia was obtained while she was on a stretcher.  She was then placed supine on the fracture table,  the perineal post in place, her left operative leg in line skeletal traction and her right nonoperative leg in a well-leg holder with appropriate padding.  We then assessed the fracture under direct fluoroscopy and was able to get it in a reasonable  amount of length.  We then prepped the left hip, thigh, knee and leg  with DuraPrep and sterile drapes.  A time-out was called and she identified as correct patient, correct left hip.  I then made an incision proximal to the greater trochanter on the  lateral aspect of the hip and dissected down to the tip of the greater trochanter.  A temporary guide pin was then placed under direct fluoroscopic guidance from the tip of the greater trochanter down past the lesser trochanter.  We then used initiating  reamer to open up the femoral canal.  We had a fracture reducer that we placed into the femoral canal to reduce the fracture so we could put a guide pin all the way down to the knee for reaming.  We verified this placement under direct fluoroscopy.  We  then began reaming from a size 9 mm reamer in stepwise increments, going up to a size 12.5 mm reamer, which got excellent chatter and was very tight.  We then took a measurement as well and chose an 11 x ____ femoral nail.  We placed this in an antegrade  fashion over the guidewire and then removed the guidewire.  We used a cephalomedullary implant given her age and through a separate lateral incision, was able to place a lag screw, but not having to perform the lag component from the lateral cortex of  the femur, traversing the inferior portion of the femoral neck into a good position in the femoral head and verified this on the lateral view as well.  We then placed a distal interlocking screw through a separate small incision from lateral  to medial.   We were pleased with the fracture reduction following this.  We then removed all instrumentation and closed all incisions with 0 Vicryl deep, 2-0 Vicryl subcutaneous tissue and interrupted staples on the skin.  Xeroform and Aquacel dressing was applied.   She was taken off the fracture table, awakened, extubated, and taken to the recovery room in stable condition.  All final counts were correct.  There were no complications noted.  Of note, Benita Stabile, PA-C, assisted during the  entire case and his  assistance was crucial with helping hold the fracture reduced from the opening to closing of the case.  VN/NUANCE  D:12/10/2019 T:12/10/2019 JOB:[id number]

## 2019-12-11 NOTE — Evaluation (Signed)
Clinical/Bedside Swallow Evaluation Patient Details  Name: ECE CUMBERLAND MRN: [id number] Date of Birth: 05-15-1946  Today's Date: 12/11/2019 Time: SLP Start Time (ACUTE ONLY): 1030 SLP Stop Time (ACUTE ONLY): 1045 SLP Time Calculation (min) (ACUTE ONLY): 15 min  Past Medical History:  Past Medical History:  Diagnosis Date  . Arthritis   . Hypertension    Past Surgical History:  Past Surgical History:  Procedure Laterality Date  . NO PAST SURGERIES     HPI:  The patient is a 74 year old female who sustained an accidental mechanical fall 7/10, landing on her left hip.  She was transported to the [facility] emergency room after severe left hip pain and inability to ambulate.  She was found to have a subtrochanteric femur fracture. Underwent surgery on 7/11 for Intramedullary nail placement of the left femur.    Assessment / Plan / Recommendation Clinical Impression  Pt demonstrates no signs of dysphagia with thin liquids. She does report occasional globus/regrugitation at baseline, so we reviewed compensatory strategies to clear esophagus. Pt is fully independent and aware. No f/u needed at this time, continue diet SLP will sign off.  SLP Visit Diagnosis: Dysphagia, unspecified (R13.10)    Aspiration Risk  Mild aspiration risk    Diet Recommendation Regular;Thin liquid   Liquid Administration via: Cup;Straw Medication Administration: Whole meds with liquid Supervision: Patient able to self feed Compensations: Slow rate;Small sips/bites;Follow solids with liquid Postural Changes: Seated upright at 90 degrees    Other  Recommendations Oral Care Recommendations: Patient independent with oral care   Follow up Recommendations None      Frequency and Duration            Prognosis        Swallow Study   General HPI: The patient is a 74 year old female who sustained an accidental mechanical fall 7/10, landing on her left hip.  She was transported to the [facility]  emergency room after severe left hip pain and inability to ambulate.  She was found to have a subtrochanteric femur fracture. Underwent surgery on 7/11 for Intramedullary nail placement of the left femur.  Type of Study: Bedside Swallow Evaluation Previous Swallow Assessment: none Diet Prior to this Study: Regular;Thin liquids Temperature Spikes Noted: No Respiratory Status: Room air History of Recent Intubation: No Behavior/Cognition: Alert;Cooperative;Pleasant mood Oral Cavity Assessment: Within Functional Limits Oral Care Completed by SLP: No Oral Cavity - Dentition: Missing dentition Vision: Functional for self-feeding Self-Feeding Abilities: Able to feed self Patient Positioning: Upright in chair Baseline Vocal Quality: Normal Volitional Cough: Strong Volitional Swallow: Able to elicit    Oral/Motor/Sensory Function Overall Oral Motor/Sensory Function: Within functional limits   Ice Chips Ice chips: Not tested   Thin Liquid Thin Liquid: Within functional limits    Nectar Thick Nectar Thick Liquid: Not tested   Honey Thick Honey Thick Liquid: Not tested   Puree Puree: Not tested   Solid     Solid: Not tested      Adyn Hoes, Katherene Ponto 12/11/2019,10:50 AM

## 2019-12-12 ENCOUNTER — Encounter (HOSPITAL_COMMUNITY): Payer: Self-pay | Admitting: Family Medicine

## 2019-12-12 LAB — TYPE AND SCREEN
ABO/RH(D): O POS
Antibody Screen: NEGATIVE
Unit division: 0
Unit division: 0

## 2019-12-12 LAB — CBC
HCT: 30.3 % — ABNORMAL LOW (ref 36.0–46.0)
Hemoglobin: 9.7 g/dL — ABNORMAL LOW (ref 12.0–15.0)
MCH: 28.1 pg (ref 26.0–34.0)
MCHC: 32 g/dL (ref 30.0–36.0)
MCV: 87.8 fL (ref 80.0–100.0)
Platelets: 120 10*3/uL — ABNORMAL LOW (ref 150–400)
RBC: 3.45 MIL/uL — ABNORMAL LOW (ref 3.87–5.11)
RDW: 15 % (ref 11.5–15.5)
WBC: 8.1 10*3/uL (ref 4.0–10.5)
nRBC: 0 % (ref 0.0–0.2)

## 2019-12-12 LAB — BPAM RBC
Blood Product Expiration Date: 202108082359
Blood Product Expiration Date: 202108082359
ISSUE DATE / TIME: 202107121619
ISSUE DATE / TIME: 202107122150
Unit Type and Rh: 5100
Unit Type and Rh: 5100

## 2019-12-12 LAB — BASIC METABOLIC PANEL
Anion gap: 10 (ref 5–15)
BUN: 20 mg/dL (ref 8–23)
CO2: 25 mmol/L (ref 22–32)
Calcium: 8.7 mg/dL — ABNORMAL LOW (ref 8.9–10.3)
Chloride: 99 mmol/L (ref 98–111)
Creatinine, Ser: 0.95 mg/dL (ref 0.44–1.00)
GFR calc Af Amer: >60 mL/min (ref >60)
GFR calc non Af Amer: 59 mL/min — ABNORMAL LOW (ref >60)
Glucose, Bld: 94 mg/dL (ref 70–99)
Potassium: 4.1 mmol/L (ref 3.5–5.1)
Sodium: 134 mmol/L — ABNORMAL LOW (ref 135–145)

## 2019-12-12 MED ORDER — OXYCODONE HCL 5 MG PO TABS: 5.0000 mg | ORAL_TABLET | ORAL | 0 refills | Status: DC | PRN

## 2019-12-12 MED ORDER — ASPIRIN 325 MG PO TBEC: 325.0000 mg | DELAYED_RELEASE_TABLET | Freq: Every day | ORAL | 0 refills | Status: AC

## 2019-12-12 NOTE — Progress Notes (Signed)
Patient ID: Savannah Dean, female   DOB: 10-26-1945, 74 y.o.   MRN: [id number] The patient seems to be doing well today.  She has been up to the bedside and in the bedside chair setting.  She is taking a few steps.  She understands that her left femur was broken quite significantly and the surgery was significant.  She did have acute blood loss anemia from her fracture and surgery and did require transfusion.  She denies any shortness of breath this afternoon and denies any fatigue.  Since she does live alone, it is recommended that she spent some time rehabilitating in short term skilled nursing.  She understands that as well.  Her dressings look good overall.  Nursing plans to change the most proximal dressing due to some blood tingeing on the dressing.  Overall she does look good.  From an orthopedic standpoint she can go to skilled nursing when a bed is available.  For DVT prophylaxis.  I would just recommend a daily aspirin as well as compressive TED hose.  She will continue attempts to weight-bear as tolerated.

## 2019-12-12 NOTE — Progress Notes (Signed)
[facility]  PROGRESS NOTE  Savannah Dean AOZ:[id number] DOB: 20-Aug-1945 DOA: 12/09/2019 PCP: System, Pcp Not In   Brief HPI:    74 year old female with history of hypertension, arthritis, scoliosis who was brought by EMS after fall at home which resulted in severe left hip pain.  X-ray showed left hip fracture.  Orthopedics was consulted.  Patient underwent intramedullary nail for subtrochanteric femur fracture.    Subjective   Patient seen and examined, feels better after blood transfusion.  Hemoglobin up to 9.7.   Assessment/Plan:     1. S/p intramedullary nail for subtrochanteric left femur fracture-stable, orthopedics following. 2. Anemia due to acute blood loss-hemoglobin dropped to 7.0 from 11.7 yesterday.  S/p 2 units PRBC, hemoglobin is up to 9.7.  Follow CBC in a.m.   3. Hypertension-blood pressure is well controlled, continue losartan 25 mg daily.   Scheduled medications:   . aspirin EC  325 mg Oral Q breakfast  . docusate sodium  100 mg Oral BID  . feeding supplement  1 Container Oral TID BM  . gabapentin  100 mg Oral BID  . losartan  25 mg Oral Daily  . multivitamin with minerals  1 tablet Oral Daily  . tiZANidine  4 mg Oral TID     Antibiotics:   Anti-infectives (From admission, onward)   Start     Dose/Rate Route Frequency Ordered Stop   12/10/19 1530  ceFAZolin (ANCEF) IVPB 2g/100 mL premix        2 g 200 mL/hr over 30 Minutes Intravenous Every 6 hours 12/10/19 1336 12/10/19 2143   12/10/19 0858  ceFAZolin (ANCEF) 2-4 GM/100ML-% IVPB       Note to Pharmacy: Laurita Quint   : cabinet override      12/10/19 0858 12/10/19 2143   12/10/19 0845  ceFAZolin (ANCEF) IVPB 2g/100 mL premix        2 g 200 mL/hr over 30 Minutes Intravenous  Once 12/10/19 0839 12/10/19 0930      CBG: No results for input(s): GLUCAP in the last 168 hours.  SpO2: 97 % O2 Flow Rate (L/min): 2 L/min FiO2 (%): 28 %    CBC: Recent Labs  Lab 12/09/19 2244  12/10/19 0406 12/11/19 1256 12/12/19 0402  WBC 10.2 11.7* 9.7 8.1  NEUTROABS 7.2  --   --   --   HGB 11.6* 11.7* 7.0* 9.7*  HCT 37.3 37.9 22.3* 30.3*  MCV 90.3 92.2 92.5 87.8  PLT 174 179 152 120*    Basic Metabolic Panel: Recent Labs  Lab 12/09/19 2244 12/10/19 0406 12/11/19 1100 12/12/19 0402  NA 139 138 131* 134*  K 3.4* 4.6 4.1 4.1  CL 106 102 98 99  CO2 23 27 21* 25  GLUCOSE 128* 142* 122* 94  BUN 15 13 19 20   CREATININE 0.77 0.68 1.11* 0.95  CALCIUM 8.6* 8.9 8.1* 8.7*     Liver Function Tests: Recent Labs  Lab 12/09/19 2244  AST 23  ALT 14  ALKPHOS 52  BILITOT 0.5  PROT 6.1*  ALBUMIN 3.8      DVT prophylaxis: Per orthopedics, aspirin 325 mg daily, TED hose  Code Status: Full code  Family Communication: No family at bedside    Status is: Inpatient  Dispo: The patient is from: Home              Anticipated d/c is to: Likely skilled nursing facility              Anticipated  d/c date is: 12/13/2019              Patient currently not medically stable for discharge  Barrier to discharge-getting blood transfusion for anemia          Consultants:  Orthopedics  Procedures:  S/p intramedullary nail for subtrochanteric left femur fracture   Objective   Vitals:   12/12/19 0300 12/12/19 0750 12/12/19 0756 12/12/19 0903  BP: (!) 107/55 (!) 145/74 (!) 108/52 126/64  Pulse: 61 66 68 64  Resp: 17  17   Temp: 98.3 F (36.8 C) 98.5 F (36.9 C) 98.7 F (37.1 C)   TempSrc: Axillary Oral Oral   SpO2: 95% 97% 97%   Weight:      Height:        Intake/Output Summary (Last 24 hours) at 12/12/2019 1456 Last data filed at 12/12/2019 0900 Gross per 24 hour  Intake 470 ml  Output 1500 ml  Net -1030 ml    07/11 1901 - 07/13 0700 In: 920 [P.O.:120; I.V.:450] Out: 1800 [Urine:1800]  Filed Weights   12/09/19 2242  Weight: 68 kg    Physical Examination:    General-appears in no acute distress  Heart-S1-S2, regular, no murmur  auscultated  Lungs-clear to auscultation bilaterally, no wheezing or crackles auscultated  Abdomen-soft, nontender, no organomegaly  Extremities-no edema in the lower extremities  Neuro-alert, oriented x3, no focal deficit noted    Data Reviewed:   Recent Results (from the past 240 hour(s))  SARS Coronavirus 2 by RT PCR (hospital order, performed in [facility] hospital lab) Nasopharyngeal Nasopharyngeal Swab     Status: None   Collection Time: 12/09/19 10:55 PM   Specimen: Nasopharyngeal Swab  Result Value Ref Range Status   SARS Coronavirus 2 NEGATIVE NEGATIVE Final    Comment: (NOTE) SARS-CoV-2 target nucleic acids are NOT DETECTED.  The SARS-CoV-2 RNA is generally detectable in upper and lower respiratory specimens during the acute phase of infection. The lowest concentration of SARS-CoV-2 viral copies this assay can detect is 250 copies / mL. A negative result does not preclude SARS-CoV-2 infection and should not be used as the sole basis for treatment or other patient management decisions.  A negative result may occur with improper specimen collection / handling, submission of specimen other than nasopharyngeal swab, presence of viral mutation(s) within the areas targeted by this assay, and inadequate number of viral copies (<250 copies / mL). A negative result must be combined with clinical observations, patient history, and epidemiological information.  Fact Sheet for Patients:   StrictlyIdeas.no  Fact Sheet for Healthcare Providers: BankingDealers.co.za  This test is not yet approved or  cleared by the [place] FDA and has been authorized for detection and/or diagnosis of SARS-CoV-2 by FDA under an Emergency Use Authorization (EUA).  This EUA will remain in effect (meaning this test can be used) for the duration of the COVID-19 declaration under Section 564(b)(1) of the Act, 21 U.S.C. section 360bbb-3(b)(1),  unless the authorization is terminated or revoked sooner.  Performed at [facility], [place] [address], [place], [place] [address]      Studies:  No results found.     Oswald Hillock   [facility] If 7PM-7AM, please contact night-coverage at [email or web address], Office  [phone]   12/12/2019, 2:56 PM  LOS: 2 days

## 2019-12-12 NOTE — Plan of Care (Signed)
  Problem: Health Behavior/Discharge Planning: Goal: Ability to manage health-related needs will improve Outcome: Progressing   Problem: Clinical Measurements: Goal: Ability to maintain clinical measurements within normal limits will improve 12/12/2019 1552 by Williams Che, RN Outcome: Progressing 12/12/2019 1458 by Williams Che, RN Outcome: Progressing Goal: Will remain free from infection Outcome: Progressing   Problem: Nutrition: Goal: Adequate nutrition will be maintained Outcome: Progressing   Problem: Coping: Goal: Level of anxiety will decrease Outcome: Progressing   Problem: Elimination: Goal: Will not experience complications related to bowel motility Outcome: Progressing   Problem: Pain Managment: Goal: General experience of comfort will improve Outcome: Progressing   Problem: Safety: Goal: Ability to remain free from injury will improve Outcome: Progressing   Problem: Skin Integrity: Goal: Risk for impaired skin integrity will decrease Outcome: Progressing

## 2019-12-12 NOTE — Progress Notes (Signed)
Rehab Admissions Coordinator Note:  Notified by CM Levada Dy that the pt's family is requesting that she be screened for appropriateness for an Inpatient Acute Rehab Consult.  Given pt's diagnosis, stable medical needs, and lack of caregiver assistance per chart review, AC agrees with therapy recommendation for SNF. CM Angela notified of recommendation for SNF. AC will not pursue CIR for this patient.    Raechel Ache 12/12/2019, 3:22 PM  I can be reached at [phone].

## 2019-12-12 NOTE — TOC Initial Note (Addendum)
Transition of Care [facility]) - Initial/Assessment Note    Patient Details  Name: Savannah Dean MRN: [id number] Date of Birth: 11-19-1945  Transition of Care [facility]) CM/SW Contact:    Sharin Mons, RN Phone Number: 12/12/2019, 3:07 PM  Clinical Narrative:       Admitted s/p fall, suffered L hip fx                - s/p Intramedullary nail placement, left femur  (7/11) Hx of hypertension, arthritis, scoliosis. From home alone. PTA independent with ADL's, no DME usage.  NCM received consult for possible SNF placement at time of discharge. NCM spoke with patient regarding PT recommendation of SNF placement at time of discharge. Patient states she would like referral to be made with CIR. Pt request I speak with her brother Jenny Reichmann [phone]) concerning her TOC needs. NCM spoke pt's brother after call made and voice message left. While speaking with Jenny Reichmann and sharing the recommendation from PT, John's wife gets on phone and  explains aggressively she a SW and if they are requesting a referral made to inpatient rehab then that what needs to be done. Again NCM reinforced PT's recommendation for SNF placement . Explained  pt lives alone, once d/c from an IPR will need caregiver to assist  ( per pt not available), and must have qualifying diagnosis to admit to IPR. NCM will f/u with CIR admission liaison to review case for potential admit   TOC team will continue to monitor for needs ....    Expected Discharge Plan: Skilled Nursing Facility Barriers to Discharge: Continued Medical Work up   Patient Goals and CMS Choice        Expected Discharge Plan and Services Expected Discharge Plan: [place]                                              Prior Living Arrangements/Services                       Activities of Daily Living Home Assistive Devices/Equipment: Kasandra Knudsen (specify quad or straight) ADL Screening (condition at time of admission) Patient's  cognitive ability adequate to safely complete daily activities?: Yes Is the patient deaf or have difficulty hearing?: Yes Does the patient have difficulty seeing, even when wearing glasses/contacts?: No Does the patient have difficulty concentrating, remembering, or making decisions?: No Patient able to express need for assistance with ADLs?: Yes Does the patient have difficulty dressing or bathing?: No Independently performs ADLs?: Yes (appropriate for developmental age) Does the patient have difficulty walking or climbing stairs?: Yes Weakness of Legs: Both Weakness of Arms/Hands: None  Permission Sought/Granted                  Emotional Assessment              Admission diagnosis:  Trauma [T14.90XA] Closed left hip fracture ([place]) [S72.002A] Closed displaced subtrochanteric fracture of left femur, initial encounter ([place]) [S72.22XA] Fall, initial encounter [W19.XXXA] Patient Active Problem List   Diagnosis Date Noted  . Closed left hip fracture ([place]) 12/10/2019  . Essential hypertension 12/10/2019  . Fall at home, initial encounter 12/10/2019  . Closed displaced subtrochanteric fracture of left femur ([place])    PCP:  System, Pcp Not In Pharmacy:   [place], [place]  [place]  [address] Phone: [phone] Fax: [phone]     Social Determinants of Health (SDOH) Interventions    Readmission Risk Interventions No flowsheet data found.

## 2019-12-12 NOTE — TOC CAGE-AID Note (Signed)
Transition of Care [facility]) - CAGE-AID Screening   Patient Details  Name: Savannah Dean MRN: [id number] Date of Birth: May 27, 1946  Transition of Care [facility]) CM/SW Contact:    Emeterio Reeve, [place] Phone Number: 12/12/2019, 4:21 PM   Clinical Narrative:  Pt declines alcohol use and substance use at this time.   CAGE-AID Screening:    Have You Ever Felt You Ought to Cut Down on Your Drinking or Drug Use?: No Have People Annoyed You By Critizing Your Drinking Or Drug Use?: No Have You Felt Bad Or Guilty About Your Drinking Or Drug Use?: No Have You Ever Had a Drink or Used Drugs First Thing In The Morning to Steady Your Nerves or to Get Rid of a Hangover?: No CAGE-AID Score: 0  Substance Abuse Education Offered: Yes     Blima Ledger, [place] Social Worker [phone]

## 2019-12-13 LAB — CBC
HCT: 26.8 % — ABNORMAL LOW (ref 36.0–46.0)
Hemoglobin: 8.5 g/dL — ABNORMAL LOW (ref 12.0–15.0)
MCH: 28.2 pg (ref 26.0–34.0)
MCHC: 31.7 g/dL (ref 30.0–36.0)
MCV: 89 fL (ref 80.0–100.0)
Platelets: 123 10*3/uL — ABNORMAL LOW (ref 150–400)
RBC: 3.01 MIL/uL — ABNORMAL LOW (ref 3.87–5.11)
RDW: 15.5 % (ref 11.5–15.5)
WBC: 6.9 10*3/uL (ref 4.0–10.5)
nRBC: 0 % (ref 0.0–0.2)

## 2019-12-13 LAB — BASIC METABOLIC PANEL
Anion gap: 8 (ref 5–15)
BUN: 19 mg/dL (ref 8–23)
CO2: 24 mmol/L (ref 22–32)
Calcium: 8.3 mg/dL — ABNORMAL LOW (ref 8.9–10.3)
Chloride: 101 mmol/L (ref 98–111)
Creatinine, Ser: 0.73 mg/dL (ref 0.44–1.00)
GFR calc Af Amer: >60 mL/min (ref >60)
GFR calc non Af Amer: >60 mL/min (ref >60)
Glucose, Bld: 105 mg/dL — ABNORMAL HIGH (ref 70–99)
Potassium: 4.1 mmol/L (ref 3.5–5.1)
Sodium: 133 mmol/L — ABNORMAL LOW (ref 135–145)

## 2019-12-13 LAB — SARS CORONAVIRUS 2 (TAT 6-24 HRS): SARS Coronavirus 2: NEGATIVE

## 2019-12-13 NOTE — Progress Notes (Signed)
[facility]  PROGRESS NOTE  [name] GYJ:[id number] DOB: 1945-06-22 DOA: 12/09/2019 PCP: System, Pcp Not In   Brief HPI:    74 year old female with history of hypertension, arthritis, scoliosis who was brought by EMS after fall at home which resulted in severe left hip pain.  X-ray showed left hip fracture.  Orthopedics was consulted.  Patient underwent intramedullary nail for subtrochanteric femur fracture.    Subjective   Patient noted to be somnolent this morning but easily arousable.  Does not answer any questions.   Assessment/Plan:   S/p intramedullary nail for subtrochanteric left femur fracture Patient seems to be stable for the most part.  Orthopedics is following.  Pain control.  Bowel regimen.  Acute blood loss anemia Hemoglobin dropped from 11.7 to 7.0.  She was transfused 2 units of blood on 7/12.  Hemoglobin came up to 9.7. 8.5 this morning.  Continue to monitor.  Recheck again tomorrow.  Essential hypertension Noted to be on losartan.  Monitor blood pressures closely.   DVT prophylaxis: Per orthopedics, aspirin 325 mg daily, TED hose Code Status: Full code Family Communication: No family at bedside  Status is: Inpatient  Remains inpatient appropriate because:Ongoing active pain requiring inpatient pain management   Dispo: The patient is from: Home              Anticipated d/c is to: SNF              Anticipated d/c date is: 1 day              Patient currently is not medically stable to d/c.      Consultants:  Orthopedics  Procedures:  S/p intramedullary nail for subtrochanteric left femur fracture   Scheduled medications:   . aspirin EC  325 mg Oral Q breakfast  . docusate sodium  100 mg Oral BID  . feeding supplement  1 Container Oral TID BM  . gabapentin  100 mg Oral BID  . losartan  25 mg Oral Daily  . multivitamin with minerals  1 tablet Oral Daily  . tiZANidine  4 mg Oral TID     Antibiotics:   Anti-infectives  (From admission, onward)   Start     Dose/Rate Route Frequency Ordered Stop   12/10/19 1530  ceFAZolin (ANCEF) IVPB 2g/100 mL premix        2 g 200 mL/hr over 30 Minutes Intravenous Every 6 hours 12/10/19 1336 12/10/19 2143   12/10/19 0858  ceFAZolin (ANCEF) 2-4 GM/100ML-% IVPB       Note to Pharmacy: Laurita Quint   : cabinet override      12/10/19 0858 12/10/19 2143   12/10/19 0845  ceFAZolin (ANCEF) IVPB 2g/100 mL premix        2 g 200 mL/hr over 30 Minutes Intravenous  Once 12/10/19 0839 12/10/19 0930      CBG: No results for input(s): GLUCAP in the last 168 hours.  SpO2: 96 % O2 Flow Rate (L/min): 1 L/min FiO2 (%): 28 %    CBC: Recent Labs  Lab 12/09/19 2244 12/10/19 0406 12/11/19 1256 12/12/19 0402 12/13/19 0148  WBC 10.2 11.7* 9.7 8.1 6.9  NEUTROABS 7.2  --   --   --   --   HGB 11.6* 11.7* 7.0* 9.7* 8.5*  HCT 37.3 37.9 22.3* 30.3* 26.8*  MCV 90.3 92.2 92.5 87.8 89.0  PLT 174 179 152 120* 123*    Basic Metabolic Panel: Recent Labs  Lab 12/09/19 2244 12/10/19 0406  12/11/19 1100 12/12/19 0402 12/13/19 0148  NA 139 138 131* 134* 133*  K 3.4* 4.6 4.1 4.1 4.1  CL 106 102 98 99 101  CO2 23 27 21* 25 24  GLUCOSE 128* 142* 122* 94 105*  BUN 15 13 19 20 19   CREATININE 0.77 0.68 1.11* 0.95 0.73  CALCIUM 8.6* 8.9 8.1* 8.7* 8.3*     Liver Function Tests: Recent Labs  Lab 12/09/19 2244  AST 23  ALT 14  ALKPHOS 52  BILITOT 0.5  PROT 6.1*  ALBUMIN 3.8      Objective   Vitals:   12/12/19 2125 12/12/19 2125 12/13/19 0550 12/13/19 0759  BP: 90/69 90/69 116/63 (!) 120/54  Pulse:  60 60 67  Resp:  18 16 17   Temp:  97.7 F (36.5 C) 98.2 F (36.8 C) 98.3 F (36.8 C)  TempSrc:  Oral Oral Oral  SpO2:  95% 97% 96%  Weight:      Height:        Intake/Output Summary (Last 24 hours) at 12/13/2019 1314 Last data filed at 12/12/2019 1600 Gross per 24 hour  Intake 0 ml  Output --  Net 0 ml    07/12 1901 - 07/14 0700 In: 240 [P.O.:240] Out: 1500  [Urine:1500]  Filed Weights   12/09/19 2242  Weight: 68 kg    Physical Examination:  General appearance: Somnolent but easily arousable.  In no distress. Resp: Normal effort.  Diminished air entry at the bases.  No wheezing rhonchi.  No definite crackles. Cardio: S1-S2 is normal regular.  No S3-S4.  No rubs murmurs or bruit GI: Abdomen is soft.  Nontender nondistended.  Bowel sounds are present normal.  No masses organomegaly Extremities: No edema.   Neurologic: Somnolent.  No obvious focal deficits noted.    Data Reviewed:   Recent Results (from the past 240 hour(s))  SARS Coronavirus 2 by RT PCR (hospital order, performed in [facility] hospital lab) Nasopharyngeal Nasopharyngeal Swab     Status: None   Collection Time: 12/09/19 10:55 PM   Specimen: Nasopharyngeal Swab  Result Value Ref Range Status   SARS Coronavirus 2 NEGATIVE NEGATIVE Final    Comment: (NOTE) SARS-CoV-2 target nucleic acids are NOT DETECTED.  The SARS-CoV-2 RNA is generally detectable in upper and lower respiratory specimens during the acute phase of infection. The lowest concentration of SARS-CoV-2 viral copies this assay can detect is 250 copies / mL. A negative result does not preclude SARS-CoV-2 infection and should not be used as the sole basis for treatment or other patient management decisions.  A negative result may occur with improper specimen collection / handling, submission of specimen other than nasopharyngeal swab, presence of viral mutation(s) within the areas targeted by this assay, and inadequate number of viral copies (<250 copies / mL). A negative result must be combined with clinical observations, patient history, and epidemiological information.  Fact Sheet for Patients:   StrictlyIdeas.no  Fact Sheet for Healthcare Providers: BankingDealers.co.za  This test is not yet approved or  cleared by the [place] FDA and has been  authorized for detection and/or diagnosis of SARS-CoV-2 by FDA under an Emergency Use Authorization (EUA).  This EUA will remain in effect (meaning this test can be used) for the duration of the COVID-19 declaration under Section 564(b)(1) of the Act, 21 U.S.C. section 360bbb-3(b)(1), unless the authorization is terminated or revoked sooner.  Performed at [facility], [place] [address], [place], [place] [address]   SARS CORONAVIRUS  2 (TAT 6-24 HRS) Nasopharyngeal Nasopharyngeal Swab     Status: None   Collection Time: 12/13/19  4:50 AM   Specimen: Nasopharyngeal Swab  Result Value Ref Range Status   SARS Coronavirus 2 NEGATIVE NEGATIVE Final    Comment: (NOTE) SARS-CoV-2 target nucleic acids are NOT DETECTED.  The SARS-CoV-2 RNA is generally detectable in upper and lower respiratory specimens during the acute phase of infection. Negative results do not preclude SARS-CoV-2 infection, do not rule out co-infections with other pathogens, and should not be used as the sole basis for treatment or other patient management decisions. Negative results must be combined with clinical observations, patient history, and epidemiological information. The expected result is Negative.  Fact Sheet for Patients: SugarRoll.be  Fact Sheet for Healthcare Providers: https://www.woods-mathews.com/  This test is not yet approved or cleared by the [place] FDA and  has been authorized for detection and/or diagnosis of SARS-CoV-2 by FDA under an Emergency Use Authorization (EUA). This EUA will remain  in effect (meaning this test can be used) for the duration of the COVID-19 declaration under Se ction 564(b)(1) of the Act, 21 U.S.C. section 360bbb-3(b)(1), unless the authorization is terminated or revoked sooner.  Performed at [facility], [place] [address] [place], [place] [address]      Studies:  No results found.     Savannah Dean   [facility] If 7PM-7AM, please contact night-coverage at [email or web address], Office  [phone]   12/13/2019, 1:14 PM  LOS: 3 days

## 2019-12-13 NOTE — NC FL2 (Signed)
[place] LEVEL OF CARE SCREENING TOOL     IDENTIFICATION  Patient Name: Savannah Dean Birthdate: 1945-07-24 Sex: female Admission Date (Current Location): 12/09/2019  [facility] and [place] Number:  Herbalist and Address:  [facility], [place] [address] [place], [place] [address]      Provider Number: [id number]  Attending Physician Name and Address:  Bonnielee Haff, MD  Relative Name and Phone Number:       Current Level of Care: Hospital Recommended Level of Care: [place] Prior Approval Number:    Date Approved/Denied:   PASRR Number: [id number]  Discharge Plan: SNF    Current Diagnoses: Patient Active Problem List   Diagnosis Date Noted  . Closed left hip fracture ([place]) 12/10/2019  . Essential hypertension 12/10/2019  . Fall at home, initial encounter 12/10/2019  . Closed displaced subtrochanteric fracture of left femur ([facility])     Orientation RESPIRATION BLADDER Height & Weight     Self, Time, Situation, Place  Normal External catheter Weight: 68 kg Height:  5\' 2"  (157.5 cm)  BEHAVIORAL SYMPTOMS/MOOD NEUROLOGICAL BOWEL NUTRITION STATUS      Continent Diet (Refer to d/c summary)  AMBULATORY STATUS COMMUNICATION OF NEEDS Skin   Extensive Assist Verbally Surgical wounds (Intramedullary nail placement, left femur , 7/11)                       Personal Care Assistance Level of Assistance  Bathing, Feeding, Dressing Bathing Assistance: Maximum assistance Feeding assistance: Independent Dressing Assistance: Maximum assistance     Functional Limitations Info  Sight, Hearing Sight Info: Adequate Hearing Info: Impaired (HOH)      SPECIAL CARE FACTORS FREQUENCY  PT (By licensed PT), OT (By licensed OT)     PT Frequency: 5x / week , evaluate and treat OT Frequency: 5x / week , evaluate and treat            Contractures Contractures Info: Not present    Additional Factors Info   Code Status, Allergies Code Status Info: Full Code Allergies Info: No Known Allergies           Current Medications (12/13/2019):  This is the current hospital active medication list Current Facility-Administered Medications  Medication Dose Route Frequency Provider Last Rate Last Admin  . acetaminophen (TYLENOL) tablet 325-650 mg  325-650 mg Oral Q6H PRN Mcarthur Rossetti, MD      . aspirin EC tablet 325 mg  325 mg Oral Q breakfast Mcarthur Rossetti, MD   325 mg at 12/13/19 0809  . docusate sodium (COLACE) capsule 100 mg  100 mg Oral BID Mcarthur Rossetti, MD   100 mg at 12/13/19 0809  . feeding supplement (BOOST / RESOURCE BREEZE) liquid 1 Container  1 Container Oral TID BM Oswald Hillock, MD   1 Container at 12/12/19 2115  . gabapentin (NEURONTIN) capsule 100 mg  100 mg Oral BID Mcarthur Rossetti, MD   100 mg at 12/13/19 0809  . HYDROcodone-acetaminophen (NORCO/VICODIN) 5-325 MG per tablet 1-2 tablet  1-2 tablet Oral Q6H PRN Mcarthur Rossetti, MD   1 tablet at 12/10/19 2113  . HYDROmorphone (DILAUDID) injection 0.5 mg  0.5 mg Intravenous Q2H PRN Mcarthur Rossetti, MD   0.5 mg at 12/10/19 0404  . losartan (COZAAR) tablet 25 mg  25 mg Oral Daily Mcarthur Rossetti, MD   25 mg at 12/13/19 0810  . menthol-cetylpyridinium (CEPACOL) lozenge  3 mg  1 lozenge Oral PRN Mcarthur Rossetti, MD       Or  . phenol (CHLORASEPTIC) mouth spray 1 spray  1 spray Mouth/Throat PRN Mcarthur Rossetti, MD      . methocarbamol (ROBAXIN) tablet 500 mg  500 mg Oral Q6H PRN Mcarthur Rossetti, MD   500 mg at 12/13/19 1324   Or  . methocarbamol (ROBAXIN) 500 mg in dextrose 5 % 50 mL IVPB  500 mg Intravenous Q6H PRN Mcarthur Rossetti, MD      . metoCLOPramide (REGLAN) tablet 5-10 mg  5-10 mg Oral Q8H PRN Mcarthur Rossetti, MD       Or  . metoCLOPramide (REGLAN) injection 5-10 mg  5-10 mg Intravenous Q8H PRN Mcarthur Rossetti, MD      .  multivitamin with minerals tablet 1 tablet  1 tablet Oral Daily Oswald Hillock, MD   1 tablet at 12/13/19 0809  . ondansetron (ZOFRAN) tablet 4 mg  4 mg Oral Q6H PRN Mcarthur Rossetti, MD       Or  . ondansetron [facility]) injection 4 mg  4 mg Intravenous Q6H PRN Mcarthur Rossetti, MD   4 mg at 12/13/19 1105  . oxyCODONE (Oxy IR/ROXICODONE) immediate release tablet 5 mg  5 mg Oral Q4H PRN Oswald Hillock, MD   5 mg at 12/13/19 4010  . tiZANidine (ZANAFLEX) tablet 4 mg  4 mg Oral TID Mcarthur Rossetti, MD   4 mg at 12/13/19 0809     Discharge Medications: Please see discharge summary for a list of discharge medications.  Relevant Imaging Results:  Relevant Lab Results:   Additional Information SS# [id number]  Sharin Mons, RN

## 2019-12-13 NOTE — Progress Notes (Signed)
Occupational Therapy Treatment Patient Details Name: Savannah Dean MRN: [id number] DOB: 09/06/1945 Today's Date: 12/13/2019    History of present illness Savannah Dean is a 74 yo female presenting after a mechanical fall at home resulting in L subtrochanteric femur fx who is now s/p IM nail placement 7/11. PMH includes: HTN, arthritis, and scoliosis.    OT comments  Pt seen for OT follow up session with focus on BADL mobility progression. Pt completed bed mobility with mod A and sit <> stand with mod A and max multimodal cueing to problem solve basic steps of transfer. Noted cognitive deficits in awareness, safety, problem solving, and STM. Pt was in saturated bed with urine, aware of this, and stating it does not bother her. Educated pt on skin integrity and importance of mobilization for independence in toileting. Continue to recommend SNF due to physical and cognitive deficits. Will continue to follow per POC listed below.   Follow Up Recommendations  SNF;Supervision/Assistance - 24 hour    Equipment Recommendations  Wheelchair (measurements OT);Wheelchair cushion (measurements OT);3 in 1 bedside commode;Hospital bed    Recommendations for Other Services      Precautions / Restrictions Precautions Precautions: Fall Precaution Comments: pt reports falling 1x/week at her home Restrictions Weight Bearing Restrictions: Yes LLE Weight Bearing: Weight bearing as tolerated       Mobility Bed Mobility Overal bed mobility: Needs Assistance Bed Mobility: Supine to Sit     Supine to sit: Mod assist     General bed mobility comments: assist to move LLE to EOB and support at trunk to attain sitting  Transfers Overall transfer level: Needs assistance Equipment used: Rolling walker (2 wheeled) Transfers: Sit to/from Stand Sit to Stand: Mod assist Stand pivot transfers: Mod assist       General transfer comment: mod A to power up with RW with multimodal cues for hand  palcement (pt attempting to go down on forearms). Significant extra time and problem solving cues needed to sequence each step of transfer    Balance Overall balance assessment: Needs assistance Sitting-balance support: Single extremity supported;Feet supported Sitting balance-Leahy Scale: Fair     Standing balance support: Bilateral upper extremity supported Standing balance-Leahy Scale: Poor Standing balance comment: reliant on BUE support                           ADL either performed or assessed with clinical judgement   ADL Overall ADL's : Needs assistance/impaired                             Toileting- Clothing Manipulation and Hygiene: Maximal assistance;Total assistance;Sit to/from stand;Sitting/lateral lean Toileting - Clothing Manipulation Details (indicate cue type and reason): pt incontinent and aware but did not make plan/alert staff need for clean up. States "I have just been peeing in the bed, they tell me thats bad and I need to be dry but it doesn't really bother me"     Functional mobility during ADLs: Moderate assistance;Rolling walker;Cueing for safety;Cueing for sequencing General ADL Comments: noted decreased cognition impacting session. Pt needing frequent redirection, safety, and problem solving cues     Vision Patient Visual Report: No change from baseline     Perception     Praxis      Cognition Arousal/Alertness: Awake/alert Behavior During Therapy: WFL for tasks assessed/performed Overall Cognitive Status: No family/caregiver present to determine baseline cognitive functioning Area of Impairment:  Memory;Safety/judgement;Problem solving                     Memory: Decreased short-term memory;Decreased recall of precautions ("can I use this L leg or no?")   Safety/Judgement: Decreased awareness of safety;Decreased awareness of deficits Awareness: Emergent Problem Solving: Slow processing;Difficulty  sequencing;Requires verbal cues General Comments: pt in bed saturated in urine and aware of this, but states it does not bother her. Needs increased time, redirection, and problem solving cues for basic BADL tasks        Exercises     Shoulder Instructions       General Comments      Pertinent Vitals/ Pain       Pain Assessment: Faces Faces Pain Scale: Hurts a little bit Pain Location: L hip Pain Descriptors / Indicators: Guarding;Sore Pain Intervention(s): Monitored during session;Repositioned  Home Living                                          Prior Functioning/Environment              Frequency           Progress Toward Goals  OT Goals(current goals can now be found in the care plan section)  Progress towards OT goals: Progressing toward goals  Acute Rehab OT Goals Patient Stated Goal: go home OT Goal Formulation: With patient Time For Goal Achievement: 12/25/19 Potential to Achieve Goals: Good  Plan Discharge plan remains appropriate    Co-evaluation                 AM-PAC OT "6 Clicks" Daily Activity     Outcome Measure   Help from another person eating meals?: None Help from another person taking care of personal grooming?: A Little Help from another person toileting, which includes using toliet, bedpan, or urinal?: Total Help from another person bathing (including washing, rinsing, drying)?: A Lot Help from another person to put on and taking off regular upper body clothing?: A Little Help from another person to put on and taking off regular lower body clothing?: A Lot 6 Click Score: 15    End of Session Equipment Utilized During Treatment: Gait belt;Rolling walker  OT Visit Diagnosis: Unsteadiness on feet (R26.81);Other abnormalities of gait and mobility (R26.89);Muscle weakness (generalized) (M62.81);History of falling (Z91.81);Pain   Activity Tolerance Patient tolerated treatment well   Patient Left in  chair;with call bell/phone within reach;with chair alarm set   Nurse Communication Mobility status        Time: 9935-7017 OT Time Calculation (min): 42 min  Charges: OT General Charges $OT Visit: 1 Visit OT Treatments $Self Care/Home Management : 38-52 mins  Zenovia Jarred, MSOT, OTR/L [facility], [facility] Office Number: [phone] Pager: [phone]  Zenovia Jarred 12/13/2019, 12:08 PM

## 2019-12-13 NOTE — Progress Notes (Signed)
Physical Therapy Treatment Patient Details Name: Savannah Dean MRN: [id number] DOB: Oct 14, 1945 Today's Date: 12/13/2019    History of Present Illness Savannah Dean is a 74 yo female presenting after a mechanical fall at home resulting in L subtrochanteric femur fx who is now s/p IM nail placement 7/11. PMH includes: HTN, arthritis, and scoliosis.     PT Comments    Pt is up to side of bed with encouragement, but is tired from her time OOB in chair.  Pt is able to stand and take steps on the walker which is helpful to work on [employer] and balance with her affected leg.  Pt is also working on managing pain, which was decreased by gentle movement of her LLE  Increase activity as tolerated to enhance effective use of LLE.   Follow Up Recommendations  SNF     Equipment Recommendations  Hospital bed    Recommendations for Other Services       Precautions / Restrictions Precautions Precautions: Fall Precaution Comments: pt reports falling 1x/week at her home Restrictions Weight Bearing Restrictions: Yes LLE Weight Bearing: Weight bearing as tolerated    Mobility  Bed Mobility Overal bed mobility: Needs Assistance Bed Mobility: Supine to Sit;Sit to Supine     Supine to sit: Mod assist Sit to supine: Mod assist   General bed mobility comments: requires both physical and verbal assist to get OOB  Transfers Overall transfer level: Needs assistance Equipment used: Rolling walker (2 wheeled) Transfers: Sit to/from Stand Sit to Stand: Max assist Stand pivot transfers: Mod assist       General transfer comment: max to stand this evening, low recall of sequence  Ambulation/Gait Ambulation/Gait assistance: Mod assist Gait Distance (Feet): 16 Feet Assistive device: Rolling walker (2 wheeled) Gait Pattern/deviations: Step-to pattern;Decreased stride length;Shuffle;Trunk flexed;Narrow base of support Gait velocity: decreased Gait velocity interpretation: <1.31 ft/sec, indicative  of household ambulator General Gait Details: pt was able to control LLE to stand and use RW, has fear but can control LLE   Stairs             Wheelchair Mobility    Modified Rankin (Stroke Patients Only)       Balance Overall balance assessment: Needs assistance Sitting-balance support: Feet supported Sitting balance-Leahy Scale: Fair   Postural control: Right lateral lean Standing balance support: Bilateral upper extremity supported;During functional activity Standing balance-Leahy Scale: Poor                              Cognition Arousal/Alertness: Awake/alert Behavior During Therapy: WFL for tasks assessed/performed Overall Cognitive Status: No family/caregiver present to determine baseline cognitive functioning Area of Impairment: Problem solving;Awareness;Safety/judgement;Following commands;Memory;Attention                   Current Attention Level: Selective Memory: Decreased short-term memory Following Commands: Follows one step commands inconsistently;Follows one step commands with increased time Safety/Judgement: Decreased awareness of safety;Decreased awareness of deficits Awareness: Intellectual Problem Solving: Slow processing;Requires verbal cues;Requires tactile cues General Comments: does not know how to sequence getting OOB and back      Exercises      General Comments General comments (skin integrity, edema, etc.): pt has O2 sats in 90"s with all effort and no dizziness in standing      Pertinent Vitals/Pain Pain Assessment: Faces Faces Pain Scale: Hurts a little bit Pain Location: L hip Pain Descriptors / Indicators: Tightness Pain Intervention(s): Limited activity within  patient's tolerance;Monitored during session;Premedicated before session    Home Living                      Prior Function            PT Goals (current goals can now be found in the care plan section) Acute Rehab PT Goals Patient  Stated Goal: go home Progress towards PT goals: Progressing toward goals    Frequency    Min 3X/week      PT Plan Current plan remains appropriate    Co-evaluation              AM-PAC PT "6 Clicks" Mobility   Outcome Measure  Help needed turning from your back to your side while in a flat bed without using bedrails?: A Little Help needed moving from lying on your back to sitting on the side of a flat bed without using bedrails?: A Little Help needed moving to and from a bed to a chair (including a wheelchair)?: A Lot Help needed standing up from a chair using your arms (e.g., wheelchair or bedside chair)?: A Lot Help needed to walk in hospital room?: A Lot Help needed climbing 3-5 steps with a railing? : Total 6 Click Score: 13    End of Session Equipment Utilized During Treatment: Gait belt;Oxygen Activity Tolerance: Patient limited by fatigue;Treatment limited secondary to medical complications (Comment) Patient left: in bed;with call bell/phone within reach;with bed alarm set Nurse Communication: Mobility status PT Visit Diagnosis: Difficulty in walking, not elsewhere classified (R26.2);Muscle weakness (generalized) (M62.81)     Time: 1701-1730 PT Time Calculation (min) (ACUTE ONLY): 29 min  Charges:  $Gait Training: 8-22 mins $Therapeutic Activity: 8-22 mins                   Ramond Dial 12/13/2019, 9:24 PM  Mee Hives, PT MS Acute Rehab Dept. Number: [place] and [place]

## 2019-12-13 NOTE — Plan of Care (Signed)

## 2019-12-13 NOTE — TOC Progression Note (Addendum)
Transition of Care [facility]) - Progression Note    Patient Details  Name: GIAVANNI ZEITLIN MRN: [id number] Date of Birth: 29-Nov-1945  [facility]) CM/SW Contact  Sharin Mons, RN Phone Number: 12/13/2019, 11:35 AM  Clinical Narrative:      Patient reported that she is currently unable to care for self  at  home given current physical needs and fall risk. Patient expressed understanding of PT recommendation and is now  agreeable to SNF placement at time of discharge vs CIR. Patient reports preference for  [facility] SNF. NCM discussed insurance authorization process and provided Medicare SNF ratings list. Patient expressed being hopeful for rehab and to feel better soon. No further questions reported at this time. NCM to continue to follow and assist with discharge planning needs. Fully COVID vaccinated.   Expected Discharge Plan: Skilled Nursing Facility Barriers to Discharge: No SNF bed, Insurance Authorization  Expected Discharge Plan and Services Expected Discharge Plan: [place]                                               Social Determinants of Health (SDOH) Interventions    Readmission Risk Interventions No flowsheet data found.

## 2019-12-14 ENCOUNTER — Ambulatory Visit: Payer: Medicare Other | Admitting: Family Medicine

## 2019-12-14 DIAGNOSIS — K5900 Constipation, unspecified: Secondary | ICD-10-CM

## 2019-12-14 LAB — BASIC METABOLIC PANEL
Anion gap: 9 (ref 5–15)
BUN: 17 mg/dL (ref 8–23)
CO2: 25 mmol/L (ref 22–32)
Calcium: 8.3 mg/dL — ABNORMAL LOW (ref 8.9–10.3)
Chloride: 100 mmol/L (ref 98–111)
Creatinine, Ser: 0.81 mg/dL (ref 0.44–1.00)
GFR calc Af Amer: >60 mL/min (ref >60)
GFR calc non Af Amer: >60 mL/min (ref >60)
Glucose, Bld: 111 mg/dL — ABNORMAL HIGH (ref 70–99)
Potassium: 4.8 mmol/L (ref 3.5–5.1)
Sodium: 134 mmol/L — ABNORMAL LOW (ref 135–145)

## 2019-12-14 LAB — CBC
HCT: 28.4 % — ABNORMAL LOW (ref 36.0–46.0)
Hemoglobin: 9.3 g/dL — ABNORMAL LOW (ref 12.0–15.0)
MCH: 29.1 pg (ref 26.0–34.0)
MCHC: 32.7 g/dL (ref 30.0–36.0)
MCV: 88.8 fL (ref 80.0–100.0)
Platelets: 168 10*3/uL (ref 150–400)
RBC: 3.2 MIL/uL — ABNORMAL LOW (ref 3.87–5.11)
RDW: 15.2 % (ref 11.5–15.5)
WBC: 8.9 10*3/uL (ref 4.0–10.5)
nRBC: 0 % (ref 0.0–0.2)

## 2019-12-14 MED ORDER — PANTOPRAZOLE SODIUM 40 MG PO TBEC
40.0000 mg | DELAYED_RELEASE_TABLET | Freq: Every day | ORAL | Status: DC
  Administered 2019-12-14 – 2019-12-15 (×2): 40 mg via ORAL
  Filled 2019-12-14 (×2): qty 1

## 2019-12-14 MED ORDER — SENNOSIDES-DOCUSATE SODIUM 8.6-50 MG PO TABS
1.0000 | ORAL_TABLET | Freq: Every day | ORAL | Status: DC
  Administered 2019-12-14: 1 via ORAL
  Filled 2019-12-14: qty 1

## 2019-12-14 MED ORDER — POLYETHYLENE GLYCOL 3350 17 G PO PACK
17.0000 g | PACK | Freq: Every day | ORAL | Status: DC
  Administered 2019-12-14 – 2019-12-15 (×2): 17 g via ORAL
  Filled 2019-12-14 (×2): qty 1

## 2019-12-14 MED ORDER — BISACODYL 10 MG RE SUPP
10.0000 mg | Freq: Once | RECTAL | Status: AC
  Administered 2019-12-14: 10 mg via RECTAL
  Filled 2019-12-14: qty 1

## 2019-12-14 NOTE — Plan of Care (Signed)

## 2019-12-14 NOTE — Progress Notes (Signed)
[facility]  PROGRESS NOTE  BRELYNN WHELLER AJO:[id number] DOB: March 18, 1946 DOA: 12/09/2019 PCP: System, Pcp Not In   Brief HPI:    74 year old female with history of hypertension, arthritis, scoliosis who was brought by EMS after fall at home which resulted in severe left hip pain.  X-ray showed left hip fracture.  Orthopedics was consulted.  Patient underwent intramedullary nail for subtrochanteric femur fracture.    Subjective   Patient reports some nausea.  Apparently she has not had a bowel movement in several days according to nursing staff.     Assessment/Plan:   S/p intramedullary nail for subtrochanteric left femur fracture Patient seems to be stable for the most part.  Orthopedics is following.  Pain control.  Bowel regimen.  Nausea/constipation Nausea most likely due to constipation.  Her abdomen is benign.  She was on Colace.  Will initiate MiraLAX.  Suppository.  Acute blood loss anemia likely postoperative Hemoglobin dropped from 11.7 to 7.0.  She was transfused 2 units of blood on 7/12.  Hemoglobin responded.  Noted to be 9.3 this morning.  No evidence for overt blood loss.    Essential hypertension Noted to be on losartan.  Blood pressure is reasonably well controlled.   DVT prophylaxis: Per orthopedics, aspirin 325 mg daily, TED hose Code Status: Full code Family Communication: No family at bedside  Status is: Inpatient  Remains inpatient appropriate because:Inpatient level of care appropriate due to severity of illness   Dispo:  Patient From: Home  Planned Disposition: [place]  Expected discharge date: 12/15/19  Medically stable for discharge: No       Consultants:  Orthopedics  Procedures:  S/p intramedullary nail for subtrochanteric left femur fracture   Scheduled medications:   . aspirin EC  325 mg Oral Q breakfast  . docusate sodium  100 mg Oral BID  . feeding supplement  1 Container Oral TID BM  .  gabapentin  100 mg Oral BID  . losartan  25 mg Oral Daily  . multivitamin with minerals  1 tablet Oral Daily  . pantoprazole  40 mg Oral Q1200  . polyethylene glycol  17 g Oral Daily  . senna-docusate  1 tablet Oral QHS  . tiZANidine  4 mg Oral TID     Antibiotics:   Anti-infectives (From admission, onward)   Start     Dose/Rate Route Frequency Ordered Stop   12/10/19 1530  ceFAZolin (ANCEF) IVPB 2g/100 mL premix        2 g 200 mL/hr over 30 Minutes Intravenous Every 6 hours 12/10/19 1336 12/10/19 2143   12/10/19 0858  ceFAZolin (ANCEF) 2-4 GM/100ML-% IVPB       Note to Pharmacy: Laurita Quint   : cabinet override      12/10/19 0858 12/10/19 2143   12/10/19 0845  ceFAZolin (ANCEF) IVPB 2g/100 mL premix        2 g 200 mL/hr over 30 Minutes Intravenous  Once 12/10/19 0839 12/10/19 0930      CBG: No results for input(s): GLUCAP in the last 168 hours.  SpO2: 92 % O2 Flow Rate (L/min): 2 L/min FiO2 (%): 28 %    CBC: Recent Labs  Lab 12/09/19 2244 12/09/19 2244 12/10/19 0406 12/11/19 1256 12/12/19 0402 12/13/19 0148 12/14/19 0235  WBC 10.2   < > 11.7* 9.7 8.1 6.9 8.9  NEUTROABS 7.2  --   --   --   --   --   --   HGB 11.6*   < >  11.7* 7.0* 9.7* 8.5* 9.3*  HCT 37.3   < > 37.9 22.3* 30.3* 26.8* 28.4*  MCV 90.3   < > 92.2 92.5 87.8 89.0 88.8  PLT 174   < > 179 152 120* 123* 168   < > = values in this interval not displayed.    Basic Metabolic Panel: Recent Labs  Lab 12/10/19 0406 12/11/19 1100 12/12/19 0402 12/13/19 0148 12/14/19 0235  NA 138 131* 134* 133* 134*  K 4.6 4.1 4.1 4.1 4.8  CL 102 98 99 101 100  CO2 27 21* 25 24 25   GLUCOSE 142* 122* 94 105* 111*  BUN 13 19 20 19 17   CREATININE 0.68 1.11* 0.95 0.73 0.81  CALCIUM 8.9 8.1* 8.7* 8.3* 8.3*     Liver Function Tests: Recent Labs  Lab 12/09/19 2244  AST 23  ALT 14  ALKPHOS 52  BILITOT 0.5  PROT 6.1*  ALBUMIN 3.8      Objective   Vitals:   12/13/19 1928 12/14/19 0310 12/14/19 0924  12/14/19 1058  BP: 103/62 (!) 109/51 138/61   Pulse: 64 61 70   Resp: 17 17 15    Temp: 98 F (36.7 C) 98.7 F (37.1 C) 98.3 F (36.8 C)   TempSrc: Oral Axillary Oral   SpO2: 98% 97% 94% 92%  Weight:      Height:        Intake/Output Summary (Last 24 hours) at 12/14/2019 1206 Last data filed at 12/14/2019 1054 Gross per 24 hour  Intake 0 ml  Output 450 ml  Net -450 ml    No intake/output data recorded.  Filed Weights   12/09/19 2242  Weight: 68 kg    Physical Examination:  General appearance: Awake alert.  In no distress Resp: Clear to auscultation bilaterally.  Normal effort Cardio: S1-S2 is normal regular.  No S3-S4.  No rubs murmurs or bruit GI: Abdomen is soft.  Nontender nondistended.  Bowel sounds are present normal.  No masses organomegaly Extremities: No bruising noted over the left lower extremity Neurologic:  No focal neurological deficits.     Data Reviewed:   Recent Results (from the past 240 hour(s))  SARS Coronavirus 2 by RT PCR (hospital order, performed in [facility] hospital lab) Nasopharyngeal Nasopharyngeal Swab     Status: None   Collection Time: 12/09/19 10:55 PM   Specimen: Nasopharyngeal Swab  Result Value Ref Range Status   SARS Coronavirus 2 NEGATIVE NEGATIVE Final    Comment: (NOTE) SARS-CoV-2 target nucleic acids are NOT DETECTED.  The SARS-CoV-2 RNA is generally detectable in upper and lower respiratory specimens during the acute phase of infection. The lowest concentration of SARS-CoV-2 viral copies this assay can detect is 250 copies / mL. A negative result does not preclude SARS-CoV-2 infection and should not be used as the sole basis for treatment or other patient management decisions.  A negative result may occur with improper specimen collection / handling, submission of specimen other than nasopharyngeal swab, presence of viral mutation(s) within the areas targeted by this assay, and inadequate number of viral copies (<250  copies / mL). A negative result must be combined with clinical observations, patient history, and epidemiological information.  Fact Sheet for Patients:   StrictlyIdeas.no  Fact Sheet for Healthcare Providers: BankingDealers.co.za  This test is not yet approved or  cleared by the [place] FDA and has been authorized for detection and/or diagnosis of SARS-CoV-2 by FDA under an Emergency Use Authorization (EUA).  This EUA will remain  in effect (meaning this test can be used) for the duration of the COVID-19 declaration under Section 564(b)(1) of the Act, 21 U.S.C. section 360bbb-3(b)(1), unless the authorization is terminated or revoked sooner.  Performed at [facility], [place] [address], [place], [place] [address]   SARS CORONAVIRUS 2 (TAT 6-24 HRS) Nasopharyngeal Nasopharyngeal Swab     Status: None   Collection Time: 12/13/19  4:50 AM   Specimen: Nasopharyngeal Swab  Result Value Ref Range Status   SARS Coronavirus 2 NEGATIVE NEGATIVE Final    Comment: (NOTE) SARS-CoV-2 target nucleic acids are NOT DETECTED.  The SARS-CoV-2 RNA is generally detectable in upper and lower respiratory specimens during the acute phase of infection. Negative results do not preclude SARS-CoV-2 infection, do not rule out co-infections with other pathogens, and should not be used as the sole basis for treatment or other patient management decisions. Negative results must be combined with clinical observations, patient history, and epidemiological information. The expected result is Negative.  Fact Sheet for Patients: SugarRoll.be  Fact Sheet for Healthcare Providers: https://www.woods-mathews.com/  This test is not yet approved or cleared by the [place] FDA and  has been authorized for detection and/or diagnosis of SARS-CoV-2 by FDA under an Emergency Use Authorization (EUA). This EUA will  remain  in effect (meaning this test can be used) for the duration of the COVID-19 declaration under Se ction 564(b)(1) of the Act, 21 U.S.C. section 360bbb-3(b)(1), unless the authorization is terminated or revoked sooner.  Performed at [facility], [place] [address] [place], [place] [address]      Studies:  No results found.     Bonnielee Haff   [facility] If 7PM-7AM, please contact night-coverage at [email or web address], Office  [phone]   12/14/2019, 12:06 PM  LOS: 4 days

## 2019-12-14 NOTE — TOC Progression Note (Signed)
Transition of Care [facility]) - Progression Note    Patient Details  Name: Savannah Dean MRN: [id number] Date of Birth: Dec 14, 1945  Transition of Care [facility]) CM/SW Contact  Sharin Mons, RN Phone Number: [phone]  12/14/2019, 3:13 PM  Clinical Narrative:     NCM shared accepted bed offers with pt . Bed offer extended by [place] and pt accepted. [employer] called to f/u on SNF authorization. NCM was given reference # [id number], start date 12/14/2019-12/18/2019. [address] CM, [place], [phone].   Pt will need updated COVID, MD and nurse made aware. TOC team will continue to monitor and follow ....     Expected Discharge Plan: [place] [facility]) Barriers to Discharge: No SNF bed  Expected Discharge Plan and Services Expected Discharge Plan: [place] [facility])                                               Social Determinants of Health (SDOH) Interventions    Readmission Risk Interventions No flowsheet data found.

## 2019-12-15 ENCOUNTER — Encounter: Payer: Self-pay | Admitting: Physician Assistant

## 2019-12-15 DIAGNOSIS — R2689 Other abnormalities of gait and mobility: Secondary | ICD-10-CM | POA: Diagnosis not present

## 2019-12-15 DIAGNOSIS — M419 Scoliosis, unspecified: Secondary | ICD-10-CM | POA: Diagnosis not present

## 2019-12-15 DIAGNOSIS — J4520 Mild intermittent asthma, uncomplicated: Secondary | ICD-10-CM | POA: Diagnosis not present

## 2019-12-15 DIAGNOSIS — D649 Anemia, unspecified: Secondary | ICD-10-CM | POA: Diagnosis not present

## 2019-12-15 DIAGNOSIS — R52 Pain, unspecified: Secondary | ICD-10-CM | POA: Diagnosis not present

## 2019-12-15 DIAGNOSIS — I10 Essential (primary) hypertension: Secondary | ICD-10-CM | POA: Diagnosis not present

## 2019-12-15 DIAGNOSIS — D62 Acute posthemorrhagic anemia: Secondary | ICD-10-CM | POA: Diagnosis not present

## 2019-12-15 DIAGNOSIS — Z9181 History of falling: Secondary | ICD-10-CM | POA: Diagnosis not present

## 2019-12-15 DIAGNOSIS — M6281 Muscle weakness (generalized): Secondary | ICD-10-CM | POA: Diagnosis not present

## 2019-12-15 DIAGNOSIS — S72002S Fracture of unspecified part of neck of left femur, sequela: Secondary | ICD-10-CM

## 2019-12-15 DIAGNOSIS — Y92009 Unspecified place in unspecified non-institutional (private) residence as the place of occurrence of the external cause: Secondary | ICD-10-CM

## 2019-12-15 DIAGNOSIS — J310 Chronic rhinitis: Secondary | ICD-10-CM | POA: Diagnosis not present

## 2019-12-15 DIAGNOSIS — S72062A Displaced articular fracture of head of left femur, initial encounter for closed fracture: Secondary | ICD-10-CM | POA: Diagnosis not present

## 2019-12-15 DIAGNOSIS — M2550 Pain in unspecified joint: Secondary | ICD-10-CM | POA: Diagnosis not present

## 2019-12-15 DIAGNOSIS — M8000XA Age-related osteoporosis with current pathological fracture, unspecified site, initial encounter for fracture: Secondary | ICD-10-CM | POA: Diagnosis not present

## 2019-12-15 DIAGNOSIS — R0902 Hypoxemia: Secondary | ICD-10-CM | POA: Diagnosis not present

## 2019-12-15 DIAGNOSIS — Z7401 Bed confinement status: Secondary | ICD-10-CM | POA: Diagnosis not present

## 2019-12-15 DIAGNOSIS — M79605 Pain in left leg: Secondary | ICD-10-CM | POA: Diagnosis not present

## 2019-12-15 DIAGNOSIS — S7222XD Displaced subtrochanteric fracture of left femur, subsequent encounter for closed fracture with routine healing: Secondary | ICD-10-CM | POA: Diagnosis not present

## 2019-12-15 DIAGNOSIS — G629 Polyneuropathy, unspecified: Secondary | ICD-10-CM | POA: Diagnosis not present

## 2019-12-15 DIAGNOSIS — K219 Gastro-esophageal reflux disease without esophagitis: Secondary | ICD-10-CM | POA: Diagnosis not present

## 2019-12-15 DIAGNOSIS — R41841 Cognitive communication deficit: Secondary | ICD-10-CM | POA: Diagnosis not present

## 2019-12-15 DIAGNOSIS — M1990 Unspecified osteoarthritis, unspecified site: Secondary | ICD-10-CM | POA: Diagnosis not present

## 2019-12-15 DIAGNOSIS — W19XXXA Unspecified fall, initial encounter: Secondary | ICD-10-CM

## 2019-12-15 LAB — BASIC METABOLIC PANEL
Anion gap: 11 (ref 5–15)
BUN: 19 mg/dL (ref 8–23)
CO2: 25 mmol/L (ref 22–32)
Calcium: 8.7 mg/dL — ABNORMAL LOW (ref 8.9–10.3)
Chloride: 100 mmol/L (ref 98–111)
Creatinine, Ser: 0.89 mg/dL (ref 0.44–1.00)
GFR calc Af Amer: >60 mL/min (ref >60)
GFR calc non Af Amer: >60 mL/min (ref >60)
Glucose, Bld: 96 mg/dL (ref 70–99)
Potassium: 4 mmol/L (ref 3.5–5.1)
Sodium: 136 mmol/L (ref 135–145)

## 2019-12-15 LAB — CBC
HCT: 30 % — ABNORMAL LOW (ref 36.0–46.0)
Hemoglobin: 9.4 g/dL — ABNORMAL LOW (ref 12.0–15.0)
MCH: 28.7 pg (ref 26.0–34.0)
MCHC: 31.3 g/dL (ref 30.0–36.0)
MCV: 91.7 fL (ref 80.0–100.0)
Platelets: 191 10*3/uL (ref 150–400)
RBC: 3.27 MIL/uL — ABNORMAL LOW (ref 3.87–5.11)
RDW: 15.1 % (ref 11.5–15.5)
WBC: 9.4 10*3/uL (ref 4.0–10.5)
nRBC: 0 % (ref 0.0–0.2)

## 2019-12-15 MED ORDER — ACETAMINOPHEN 325 MG PO TABS: 325.0000 mg | ORAL_TABLET | Freq: Four times a day (QID) | ORAL | Status: DC | PRN

## 2019-12-15 MED ORDER — DOCUSATE SODIUM 100 MG PO CAPS: 100.0000 mg | ORAL_CAPSULE | Freq: Two times a day (BID) | ORAL | 0 refills | Status: AC

## 2019-12-15 MED ORDER — POLYETHYLENE GLYCOL 3350 17 G PO PACK: 17.0000 g | PACK | Freq: Every day | ORAL | 0 refills | Status: DC

## 2019-12-15 MED ORDER — METHOCARBAMOL 500 MG PO TABS: 500.0000 mg | ORAL_TABLET | Freq: Four times a day (QID) | ORAL | Status: DC | PRN

## 2019-12-15 MED ORDER — SENNOSIDES-DOCUSATE SODIUM 8.6-50 MG PO TABS: 1.0000 | ORAL_TABLET | Freq: Every day | ORAL | Status: AC

## 2019-12-15 MED ORDER — ADULT MULTIVITAMIN W/MINERALS CH: 1.0000 | ORAL_TABLET | Freq: Every day | ORAL | Status: AC

## 2019-12-15 NOTE — TOC Transition Note (Signed)
Transition of Care [facility]) - CM/SW Discharge Note   Patient Details  Name: Savannah Dean MRN: [id number] Date of Birth: Oct 28, 1945  Transition of Care [facility]) CM/SW Contact:  Sharin Mons, RN Phone Number: 12/15/2019, 1:06 PM   Clinical Narrative:    Patient will DC to: [place]  Anticipated DC date: 12/15/2019 Family notified: Jenny Reichmann ( brother) Transport by: Corey Harold   Per MD patient ready for DC today . RN, patient, patient's family, and facility notified of DC. Discharge Summary and FL2 sent to facility. RN to call report prior to discharge [phone]). DC packet on chart. Ambulance transport requested for patient.   RNCM will sign off for now as intervention is no longer needed. Please consult [place] again if new needs arise.    Final next level of care: Skilled Nursing Facility Barriers to Discharge: No Barriers Identified   Patient Goals and CMS Choice   CMS Medicare.gov Compare Post Acute Care list provided to:: Patient    Discharge Placement                       Discharge Plan and Services                                     Social Determinants of Health (SDOH) Interventions     Readmission Risk Interventions No flowsheet data found.

## 2019-12-15 NOTE — Discharge Summary (Signed)
Savannah Dean:[id number] DOB: 1946/03/30 DOA: 12/09/2019  PCP: System, Pcp Not In  Admit date: 12/09/2019 Discharge date: 12/15/2019  Admitted From: home Disposition: [facility] rehab [facility] nursing [facility]  Recommendations for Outpatient Follow-up:  1. Follow up with PCP in 1 week 2. Please obtain BMP/CBC in one week 3. Follow-up orthopedics Dr. Ninfa Linden in 1 week      Discharge Condition:Stable CODE STATUS: Full Diet recommendation: Heart Healthy  Brief/Interim Summary: Savannah Dean is a 74 y.o. female with medical history significant for hypertension, arthritis and scoliosis who presents by EMS after a fall at home which is resulted in severe left hip pain.  Patient was walking in her house when she lost her balance and tripped when she bumped into a stepping stool and fell to the ground.  She landed on her left side.  She had severe left hip pain as soon as she fell and she was unable to stand up.  Patient was found with left subtrochanteric femur fracture.  S/p intramedullary nail for subtrochanteric left femur fracture Needs TEDs  placement and aspirin 325 daily for DVT prophylaxis  Nausea/constipation Nausea most likely due to constipation.  Her abdomen is benign.   She was started on bowel regimen  Acute blood loss anemia likely postoperative Hemoglobin dropped from 11.7 to 7.0.  She was transfused 2 units of blood on 7/12.  Hemoglobin responded.  Noted to be 9.4 this morning.  No evidence for overt blood loss.    Essential hypertension STable continue losartan.     Discharge Diagnoses:  Principal Problem:   Closed left hip fracture [facility]) Active Problems:   Essential hypertension   Fall at home, initial encounter    Discharge Instructions   Allergies as of 12/15/2019   No Known Allergies     Medication List    STOP taking these medications   meloxicam 15 MG tablet Commonly known as: MOBIC     TAKE these medications   acetaminophen  325 MG tablet Commonly known as: TYLENOL Take 1-2 tablets (325-650 mg total) by mouth every 6 (six) hours as needed for mild pain (pain score 1-3 or temp > 100.5).   alendronate 70 MG tablet Commonly known as: FOSAMAX Take 70 mg by mouth every Saturday.   aspirin 325 MG EC tablet Take 1 tablet (325 mg total) by mouth daily with breakfast.   docusate sodium 100 MG capsule Commonly known as: COLACE Take 1 capsule (100 mg total) by mouth 2 (two) times daily.   EPINEPHrine 0.3 mg/0.3 mL Soaj injection Commonly known as: EPI-PEN Inject 0.3 mg into the muscle as needed for anaphylaxis.   fluticasone 50 MCG/ACT nasal spray Commonly known as: FLONASE Place 1 spray into both nostrils daily as needed for allergies.   gabapentin 100 MG capsule Commonly known as: NEURONTIN Take 100 mg by mouth in the morning and at bedtime.   levocetirizine 5 MG tablet Commonly known as: XYZAL Take 5 mg by mouth daily.   losartan 25 MG tablet Commonly known as: COZAAR Take 25 mg by mouth daily.   methocarbamol 500 MG tablet Commonly known as: ROBAXIN Take 1 tablet (500 mg total) by mouth every 6 (six) hours as needed for muscle spasms.   multivitamin with minerals Tabs tablet Take 1 tablet by mouth daily. Start taking on: December 16, 2019   omeprazole 20 MG capsule Commonly known as: PRILOSEC Take 20 mg by mouth daily.   oxyCODONE 5 MG immediate release tablet Commonly known  as: Oxy IR/ROXICODONE Take 1 tablet (5 mg total) by mouth every 4 (four) hours as needed for moderate pain or severe pain.   polyethylene glycol 17 g packet Commonly known as: MIRALAX / GLYCOLAX Take 17 g by mouth daily. Start taking on: December 16, 2583   ProAir RespiClick 277 (90 Base) MCG/ACT Aepb Generic drug: Albuterol Sulfate Inhale 2 puffs into the lungs every 6 (six) hours as needed (for wheezing).   senna-docusate 8.6-50 MG tablet Commonly known as: Senokot-S Take 1 tablet by mouth at bedtime.   tiZANidine 4  MG tablet Commonly known as: ZANAFLEX Take 4 mg by mouth 3 (three) times daily.       Contact information for follow-up providers    Mcarthur Rossetti, MD. Schedule an appointment as soon as possible for a visit in 2 week(s).   Specialty: Orthopedic Surgery Contact information: [place] [address] [phone]            Contact information for after-discharge care    Destination    [facility] .   Service: Skilled Nursing Contact information: [address] [place] [phone]                 No Known Allergies  Consultations:  orthopedics   Procedures/Studies: DG Chest Port 1 View  Result Date: 12/09/2019 CLINICAL DATA:  Pain status post fall EXAM: PORTABLE CHEST 1 VIEW COMPARISON:  None. FINDINGS: There is significant elevation of the right hemidiaphragm. No evidence for pneumothorax. No acute displaced fracture. The heart size is unremarkable. There are atherosclerotic changes of the thoracic aorta. IMPRESSION: 1. No acute cardiopulmonary process. 2. Significant elevation of the right hemidiaphragm. Electronically Signed   By: Constance Holster M.D.   On: 12/09/2019 23:10   DG C-Arm 1-60 Min  Result Date: 12/10/2019 CLINICAL DATA:  Femur fracture repair. EXAM: DG C-ARM 1-60 MIN FLUOROSCOPY TIME:  Fluoroscopy Time:  3 minutes and 24 seconds Number of Acquired Spot Images: 8 COMPARISON:  None. FINDINGS: A gamma nail has been placed through the femoral neck. An intramedullary rod has been placed through the femur, crossing the fracture site. A distal interlocking screw is identified. No other acute abnormalities. IMPRESSION: Femoral fracture repair with a gamma nail and intramedullary femoral rod as above. Electronically Signed   By: Dorise Bullion III M.D   On: 12/10/2019 13:23   DG FEMUR PORT 1V LEFT  Result Date: 12/09/2019 CLINICAL DATA:  Trauma,  fell EXAM: LEFT FEMUR PORTABLE 1 VIEW COMPARISON:  None. FINDINGS: Three frontal views of the left femur are obtained. There is a mildly comminuted oblique proximal left femoral diaphyseal fracture with varus angulation at the fracture site. No evidence of hip dislocation. Distal femur is unremarkable. IMPRESSION: 1. Mildly comminuted oblique proximal left femoral diaphyseal fracture. Electronically Signed   By: Randa Ngo M.D.   On: 12/09/2019 23:10   DG FEMUR MIN 2 VIEWS LEFT  Result Date: 12/10/2019 CLINICAL DATA:  Femoral fracture repair. EXAM: LEFT FEMUR 2 VIEWS COMPARISON:  None. FINDINGS: Throughout the study, a femoral fracture has been repaired with a gamma nail and intramedullary femoral rod. IMPRESSION: Fracture repair as above. Electronically Signed   By: Dorise Bullion III M.D   On: 12/10/2019 13:24       Subjective:  Sleepy this am, has no complaints Discharge Exam: Vitals:   12/15/19 0442 12/15/19 0748  BP: (!) 131/49 (!) 137/59  Pulse: 71  66  Resp: 16 15  Temp: 98.7 F (37.1 C) 98.6 F (37 C)  SpO2: 94% 98%   Vitals:   12/14/19 1605 12/14/19 1950 12/15/19 0442 12/15/19 0748  BP: 127/61 120/64 (!) 131/49 (!) 137/59  Pulse: 68 72 71 66  Resp: 18 17 16 15   Temp: 98.7 F (37.1 C) 98.6 F (37 C) 98.7 F (37.1 C) 98.6 F (37 C)  TempSrc:  Oral Oral Oral  SpO2: 94% 96% 94% 98%  Weight:      Height:        General: Pt is sleepy,  not in acute distress Cardiovascular: RRR, S1/S2 +, no rubs, no gallops Respiratory: CTA bilaterally, no wheezing, no rhonchi Abdominal: Soft, NT, ND, bowel sounds + Extremities: no edema, no cyanosis    The results of significant diagnostics from this hospitalization (including imaging, microbiology, ancillary and laboratory) are listed below for reference.     Microbiology: Recent Results (from the past 240 hour(s))  SARS Coronavirus 2 by RT PCR (hospital order, performed in [facility] hospital lab) Nasopharyngeal  Nasopharyngeal Swab     Status: None   Collection Time: 12/09/19 10:55 PM   Specimen: Nasopharyngeal Swab  Result Value Ref Range Status   SARS Coronavirus 2 NEGATIVE NEGATIVE Final    Comment: (NOTE) SARS-CoV-2 target nucleic acids are NOT DETECTED.  The SARS-CoV-2 RNA is generally detectable in upper and lower respiratory specimens during the acute phase of infection. The lowest concentration of SARS-CoV-2 viral copies this assay can detect is 250 copies / mL. A negative result does not preclude SARS-CoV-2 infection and should not be used as the sole basis for treatment or other patient management decisions.  A negative result may occur with improper specimen collection / handling, submission of specimen other than nasopharyngeal swab, presence of viral mutation(s) within the areas targeted by this assay, and inadequate number of viral copies (<250 copies / mL). A negative result must be combined with clinical observations, patient history, and epidemiological information.  Fact Sheet for Patients:   StrictlyIdeas.no  Fact Sheet for Healthcare Providers: BankingDealers.co.za  This test is not yet approved or  cleared by the [place] FDA and has been authorized for detection and/or diagnosis of SARS-CoV-2 by FDA under an Emergency Use Authorization (EUA).  This EUA will remain in effect (meaning this test can be used) for the duration of the COVID-19 declaration under Section 564(b)(1) of the Act, 21 U.S.C. section 360bbb-3(b)(1), unless the authorization is terminated or revoked sooner.  Performed at [facility], [place] [address] [place], [place] [address]   SARS CORONAVIRUS 2 (TAT 6-24 HRS) Nasopharyngeal Nasopharyngeal Swab     Status: None   Collection Time: 12/13/19  4:50 AM   Specimen: Nasopharyngeal Swab  Result Value Ref Range Status   SARS Coronavirus 2 NEGATIVE NEGATIVE Final    Comment: (NOTE) SARS-CoV-2  target nucleic acids are NOT DETECTED.  The SARS-CoV-2 RNA is generally detectable in upper and lower respiratory specimens during the acute phase of infection. Negative results do not preclude SARS-CoV-2 infection, do not rule out co-infections with other pathogens, and should not be used as the sole basis for treatment or other patient management decisions. Negative results must be combined with clinical observations, patient history, and epidemiological information. The expected result is Negative.  Fact Sheet for Patients: SugarRoll.be  Fact Sheet for Healthcare Providers: https://www.woods-mathews.com/  This test is not yet approved or cleared by the [place] and  has been authorized  for detection and/or diagnosis of SARS-CoV-2 by FDA under an Emergency Use Authorization (EUA). This EUA will remain  in effect (meaning this test can be used) for the duration of the COVID-19 declaration under Se ction 564(b)(1) of the Act, 21 U.S.C. section 360bbb-3(b)(1), unless the authorization is terminated or revoked sooner.  Performed at [facility], [place] [address], [place], [place] [address]      Labs: BNP (last 3 results) No results for input(s): BNP in the last 8760 hours. Basic Metabolic Panel: Recent Labs  Lab 12/11/19 1100 12/12/19 0402 12/13/19 0148 12/14/19 0235 12/15/19 0338  NA 131* 134* 133* 134* 136  K 4.1 4.1 4.1 4.8 4.0  CL 98 99 101 100 100  CO2 21* 25 24 25 25   GLUCOSE 122* 94 105* 111* 96  BUN 19 20 19 17 19   CREATININE 1.11* 0.95 0.73 0.81 0.89  CALCIUM 8.1* 8.7* 8.3* 8.3* 8.7*   Liver Function Tests: Recent Labs  Lab 12/09/19 2244  AST 23  ALT 14  ALKPHOS 52  BILITOT 0.5  PROT 6.1*  ALBUMIN 3.8   No results for input(s): LIPASE, AMYLASE in the last 168 hours. No results for input(s): AMMONIA in the last 168 hours. CBC: Recent Labs  Lab 12/09/19 2244 12/10/19 0406 12/11/19 1256  12/12/19 0402 12/13/19 0148 12/14/19 0235 12/15/19 0338  WBC 10.2   < > 9.7 8.1 6.9 8.9 9.4  NEUTROABS 7.2  --   --   --   --   --   --   HGB 11.6*   < > 7.0* 9.7* 8.5* 9.3* 9.4*  HCT 37.3   < > 22.3* 30.3* 26.8* 28.4* 30.0*  MCV 90.3   < > 92.5 87.8 89.0 88.8 91.7  PLT 174   < > 152 120* 123* 168 191   < > = values in this interval not displayed.   Cardiac Enzymes: No results for input(s): CKTOTAL, CKMB, CKMBINDEX, TROPONINI in the last 168 hours. BNP: Invalid input(s): POCBNP CBG: No results for input(s): GLUCAP in the last 168 hours. D-Dimer No results for input(s): DDIMER in the last 72 hours. Hgb A1c No results for input(s): HGBA1C in the last 72 hours. Lipid Profile No results for input(s): CHOL, HDL, LDLCALC, TRIG, CHOLHDL, LDLDIRECT in the last 72 hours. Thyroid function studies No results for input(s): TSH, T4TOTAL, T3FREE, THYROIDAB in the last 72 hours.  Invalid input(s): FREET3 Anemia work up No results for input(s): VITAMINB12, FOLATE, FERRITIN, TIBC, IRON, RETICCTPCT in the last 72 hours. Urinalysis No results found for: COLORURINE, APPEARANCEUR, [place], [place], [place], [place], [place], [place], PROTEINUR, UROBILINOGEN, NITRITE, LEUKOCYTESUR Sepsis Labs Invalid input(s): PROCALCITONIN,  WBC,  LACTICIDVEN Microbiology Recent Results (from the past 240 hour(s))  SARS Coronavirus 2 by RT PCR (hospital order, performed in [facility] hospital lab) Nasopharyngeal Nasopharyngeal Swab     Status: None   Collection Time: 12/09/19 10:55 PM   Specimen: Nasopharyngeal Swab  Result Value Ref Range Status   SARS Coronavirus 2 NEGATIVE NEGATIVE Final    Comment: (NOTE) SARS-CoV-2 target nucleic acids are NOT DETECTED.  The SARS-CoV-2 RNA is generally detectable in upper and lower respiratory specimens during the acute phase of infection. The lowest concentration of SARS-CoV-2 viral copies this assay can detect is 250 copies / mL. A negative result does not  preclude SARS-CoV-2 infection and should not be used as the sole basis for treatment or other patient management decisions.  A negative result may occur with improper specimen collection /  handling, submission of specimen other than nasopharyngeal swab, presence of viral mutation(s) within the areas targeted by this assay, and inadequate number of viral copies (<250 copies / mL). A negative result must be combined with clinical observations, patient history, and epidemiological information.  Fact Sheet for Patients:   StrictlyIdeas.no  Fact Sheet for Healthcare Providers: BankingDealers.co.za  This test is not yet approved or  cleared by the [place] FDA and has been authorized for detection and/or diagnosis of SARS-CoV-2 by FDA under an Emergency Use Authorization (EUA).  This EUA will remain in effect (meaning this test can be used) for the duration of the COVID-19 declaration under Section 564(b)(1) of the Act, 21 U.S.C. section 360bbb-3(b)(1), unless the authorization is terminated or revoked sooner.  Performed at [facility], [place] [address] [place], [place] [address]   SARS CORONAVIRUS 2 (TAT 6-24 HRS) Nasopharyngeal Nasopharyngeal Swab     Status: None   Collection Time: 12/13/19  4:50 AM   Specimen: Nasopharyngeal Swab  Result Value Ref Range Status   SARS Coronavirus 2 NEGATIVE NEGATIVE Final    Comment: (NOTE) SARS-CoV-2 target nucleic acids are NOT DETECTED.  The SARS-CoV-2 RNA is generally detectable in upper and lower respiratory specimens during the acute phase of infection. Negative results do not preclude SARS-CoV-2 infection, do not rule out co-infections with other pathogens, and should not be used as the sole basis for treatment or other patient management decisions. Negative results must be combined with clinical observations, patient history, and epidemiological information. The expected result is  Negative.  Fact Sheet for Patients: SugarRoll.be  Fact Sheet for Healthcare Providers: https://www.woods-mathews.com/  This test is not yet approved or cleared by the [place] FDA and  has been authorized for detection and/or diagnosis of SARS-CoV-2 by FDA under an Emergency Use Authorization (EUA). This EUA will remain  in effect (meaning this test can be used) for the duration of the COVID-19 declaration under Se ction 564(b)(1) of the Act, 21 U.S.C. section 360bbb-3(b)(1), unless the authorization is terminated or revoked sooner.  Performed at [facility], [place] [address], [place], [place] [address]      Time coordinating discharge: Over 30 minutes  SIGNED:   Nolberto Hanlon, MD  [facility] 12/15/2019, 12:38 PM Pager   If 7PM-7AM, please contact night-coverage [email or web address] Password [email or web address]

## 2019-12-15 NOTE — Progress Notes (Signed)
Report called to [facility] at [facility]. All questions answered. Pt belongings gathered to be sent with her.

## 2019-12-15 NOTE — Plan of Care (Signed)
?  Problem: Health Behavior/Discharge Planning: ?Goal: Ability to manage health-related needs will improve ?Outcome: Progressing ?  ?Problem: Activity: ?Goal: Risk for activity intolerance will decrease ?Outcome: Progressing ?  ?Problem: Elimination: ?Goal: Will not experience complications related to bowel motility ?Outcome: Progressing ?  ?

## 2019-12-18 DIAGNOSIS — S72062A Displaced articular fracture of head of left femur, initial encounter for closed fracture: Secondary | ICD-10-CM | POA: Diagnosis not present

## 2019-12-18 DIAGNOSIS — J310 Chronic rhinitis: Secondary | ICD-10-CM | POA: Diagnosis not present

## 2019-12-18 DIAGNOSIS — G629 Polyneuropathy, unspecified: Secondary | ICD-10-CM | POA: Diagnosis not present

## 2019-12-18 DIAGNOSIS — M8000XA Age-related osteoporosis with current pathological fracture, unspecified site, initial encounter for fracture: Secondary | ICD-10-CM | POA: Diagnosis not present

## 2019-12-26 ENCOUNTER — Other Ambulatory Visit: Payer: Self-pay

## 2019-12-26 MED ORDER — NEOMYCIN-POLYMYXIN-DEXAMETH 3.5-10000-0.1 OP OINT: TOPICAL_OINTMENT | OPHTHALMIC | 0 refills | Status: DC

## 2019-12-28 ENCOUNTER — Ambulatory Visit (INDEPENDENT_AMBULATORY_CARE_PROVIDER_SITE_OTHER): Payer: Medicare Other | Admitting: Orthopaedic Surgery

## 2019-12-28 ENCOUNTER — Encounter: Payer: Self-pay | Admitting: Orthopaedic Surgery

## 2019-12-28 ENCOUNTER — Ambulatory Visit (INDEPENDENT_AMBULATORY_CARE_PROVIDER_SITE_OTHER): Payer: Medicare Other

## 2019-12-28 DIAGNOSIS — M79605 Pain in left leg: Secondary | ICD-10-CM | POA: Diagnosis not present

## 2019-12-28 DIAGNOSIS — S7222XD Displaced subtrochanteric fracture of left femur, subsequent encounter for closed fracture with routine healing: Secondary | ICD-10-CM

## 2019-12-28 NOTE — Progress Notes (Signed)
The patient is about 2 and half weeks status post intramedullary nail placement in her left femur to treat a comminuted subtrochanteric femur fracture.  She is staying at a nursing care facility.  She states that they are in a transition her to home in another week.  She reports that she is having some pain and she does walk with a walker.  She does complain of swelling in the leg.  There is no swelling in the calf today.  On exam her incision sites look good so the staples been removed and Steri-Strips applied.  She does tolerate any putting her knee and hip to the left side the range of motion.  2 views of the left femur show intact hardware.  There is no interval healing yet which is too short a time.  Since surgery to show healing.  There is no complicating features of the hardware.  We will have her continue increase her activities as comfort allows with only being up with a walker.  She can still attend weightbearing as tolerated.  I would like to see her back in 4 weeks with a repeat 2 views of the left femur.

## 2020-01-04 DIAGNOSIS — R41841 Cognitive communication deficit: Secondary | ICD-10-CM | POA: Diagnosis not present

## 2020-01-04 DIAGNOSIS — R2689 Other abnormalities of gait and mobility: Secondary | ICD-10-CM | POA: Diagnosis not present

## 2020-01-04 DIAGNOSIS — S7222XD Displaced subtrochanteric fracture of left femur, subsequent encounter for closed fracture with routine healing: Secondary | ICD-10-CM | POA: Diagnosis not present

## 2020-01-04 DIAGNOSIS — M6281 Muscle weakness (generalized): Secondary | ICD-10-CM | POA: Diagnosis not present

## 2020-01-05 ENCOUNTER — Other Ambulatory Visit: Payer: Self-pay | Admitting: Family Medicine

## 2020-01-05 DIAGNOSIS — R2689 Other abnormalities of gait and mobility: Secondary | ICD-10-CM | POA: Diagnosis not present

## 2020-01-05 DIAGNOSIS — R200 Anesthesia of skin: Secondary | ICD-10-CM

## 2020-01-05 DIAGNOSIS — S7222XD Displaced subtrochanteric fracture of left femur, subsequent encounter for closed fracture with routine healing: Secondary | ICD-10-CM | POA: Diagnosis not present

## 2020-01-05 DIAGNOSIS — M6281 Muscle weakness (generalized): Secondary | ICD-10-CM | POA: Diagnosis not present

## 2020-01-05 DIAGNOSIS — R41841 Cognitive communication deficit: Secondary | ICD-10-CM | POA: Diagnosis not present

## 2020-01-06 DIAGNOSIS — S7222XD Displaced subtrochanteric fracture of left femur, subsequent encounter for closed fracture with routine healing: Secondary | ICD-10-CM | POA: Diagnosis not present

## 2020-01-06 DIAGNOSIS — M6281 Muscle weakness (generalized): Secondary | ICD-10-CM | POA: Diagnosis not present

## 2020-01-06 DIAGNOSIS — Z9181 History of falling: Secondary | ICD-10-CM | POA: Diagnosis not present

## 2020-01-08 ENCOUNTER — Other Ambulatory Visit: Payer: Self-pay | Admitting: Family Medicine

## 2020-01-08 ENCOUNTER — Telehealth: Payer: Self-pay | Admitting: Family Medicine

## 2020-01-08 DIAGNOSIS — R200 Anesthesia of skin: Secondary | ICD-10-CM

## 2020-01-08 NOTE — Telephone Encounter (Signed)
Changed to televisit

## 2020-01-09 ENCOUNTER — Other Ambulatory Visit: Payer: Self-pay | Admitting: Family Medicine

## 2020-01-09 ENCOUNTER — Other Ambulatory Visit: Payer: Self-pay | Admitting: *Deleted

## 2020-01-09 DIAGNOSIS — F339 Major depressive disorder, recurrent, unspecified: Secondary | ICD-10-CM

## 2020-01-09 DIAGNOSIS — M41115 Juvenile idiopathic scoliosis, thoracolumbar region: Secondary | ICD-10-CM

## 2020-01-09 MED ORDER — MELOXICAM 15 MG PO TABS: ORAL_TABLET | ORAL | 0 refills | Status: DC

## 2020-01-09 MED ORDER — CITALOPRAM HYDROBROMIDE 40 MG PO TABS: 60.0000 mg | ORAL_TABLET | Freq: Every day | ORAL | 0 refills | Status: DC

## 2020-01-12 ENCOUNTER — Ambulatory Visit (INDEPENDENT_AMBULATORY_CARE_PROVIDER_SITE_OTHER): Payer: Medicare Other | Admitting: Family Medicine

## 2020-01-12 ENCOUNTER — Encounter: Payer: Self-pay | Admitting: Family Medicine

## 2020-01-12 DIAGNOSIS — S72002A Fracture of unspecified part of neck of left femur, initial encounter for closed fracture: Secondary | ICD-10-CM

## 2020-01-12 NOTE — Progress Notes (Signed)
    Subjective:    Patient ID: Savannah Dean, female    DOB: 1946-03-16, 74 y.o.   MRN: [id number]   HPI: Savannah Dean is a 74 y.o. female presenting for  presents for fell and broke her femur. Has a friend working with her. The friend is a retired [employer] scientist. She is able to do ADLs without assistance. Daughter helped her stock the kitchen. Following a healthy diet. Having some edema in the affected limb.. Following with Dr. Rush Farmer of ortho. He will see her on the 26th of August. Does NOT want home PT. She has been home 6 days ago. She was in hospital 2 weeks and in rehab 20 days.    Depression screen [facility] 2/9 06/29/2019 06/14/2018 11/16/2017 05/14/2017 11/12/2016  Decreased Interest 0 0 0 0 0  Down, Depressed, Hopeless 0 0 0 0 0  PHQ - 2 Score 0 0 0 0 0  Altered sleeping 0 - - - -  Tired, decreased energy 0 - - - -  Change in appetite 0 - - - -  Feeling bad or failure about yourself  0 - - - -  Trouble concentrating 0 - - - -  Moving slowly or fidgety/restless 0 - - - -  Suicidal thoughts 0 - - - -  PHQ-9 Score 0 - - - -  Difficult doing work/chores Not difficult at all - - - -     Relevant past medical, surgical, family and social history reviewed and updated as indicated.  Interim medical history since our last visit reviewed. Allergies and medications reviewed and updated.  ROS:  Review of Systems  Constitutional: Negative.   HENT: Negative.   Eyes: Negative for visual disturbance.  Respiratory: Negative for shortness of breath.   Cardiovascular: Negative for chest pain.  Gastrointestinal: Negative for abdominal pain.  Musculoskeletal: Negative for arthralgias.     Social History   Tobacco Use  Smoking Status Never Smoker  Smokeless Tobacco Never Used       Objective:     Wt Readings from Last 3 Encounters:  12/09/19 150 lb (68 kg)  06/29/19 157 lb 12.8 oz (71.6 kg)  06/14/18 150 lb 12.8 oz (68.4 kg)     Exam deferred. Pt. Harboring due to COVID 19.  Phone visit performed.   Assessment & Plan:   1. Closed fracture of left hip, initial encounter [facility])     Since she is ambulatory and doing her ADLs, Home PT can be cancelled.  Virtual Visit via telephone Note  I discussed the limitations, risks, security and privacy concerns of performing an evaluation and management service by telephone and the availability of in person appointments. The patient was identified with two identifiers. Pt.expressed understanding and agreed to proceed. Pt. Is at home. Dr. Livia Snellen is in his office.  Follow Up Instructions:   I discussed the assessment and treatment plan with the patient. The patient was provided an opportunity to ask questions and all were answered. The patient agreed with the plan and demonstrated an understanding of the instructions.   The patient was advised to call back or seek an in-person evaluation if the symptoms worsen or if the condition fails to improve as anticipated.   Total minutes including chart review and phone contact time: 14   Follow up plan: Return if symptoms worsen or fail to improve.  Claretta Fraise, MD Secretary

## 2020-01-15 DIAGNOSIS — M6281 Muscle weakness (generalized): Secondary | ICD-10-CM | POA: Diagnosis not present

## 2020-01-15 DIAGNOSIS — S7222XD Displaced subtrochanteric fracture of left femur, subsequent encounter for closed fracture with routine healing: Secondary | ICD-10-CM | POA: Diagnosis not present

## 2020-01-15 DIAGNOSIS — Z9181 History of falling: Secondary | ICD-10-CM | POA: Diagnosis not present

## 2020-01-25 ENCOUNTER — Ambulatory Visit (INDEPENDENT_AMBULATORY_CARE_PROVIDER_SITE_OTHER): Payer: Medicare Other | Admitting: Orthopaedic Surgery

## 2020-01-25 ENCOUNTER — Encounter: Payer: Self-pay | Admitting: Orthopaedic Surgery

## 2020-01-25 ENCOUNTER — Ambulatory Visit (INDEPENDENT_AMBULATORY_CARE_PROVIDER_SITE_OTHER): Payer: Medicare Other

## 2020-01-25 DIAGNOSIS — S7222XD Displaced subtrochanteric fracture of left femur, subsequent encounter for closed fracture with routine healing: Secondary | ICD-10-CM

## 2020-01-25 NOTE — Progress Notes (Signed)
The patient is a very pleasant 74 year old female who is between 6 and 7 weeks status post a subtrochanteric left femur fracture.  We fixed this with an intramedullary nail.  She is weightbearing as tolerated now and says her feet do swell but overall she is doing well.  She does walk a lot at times with a walker and today she is in a wheelchair but again states that she gets around fine with a walker.  On examination of her left hip and femur, I can put her through internal ex rotation as well as flexion extension without much pain or difficulty at all.  2 views of the left femur show the fracture is still easily visible with only a small amount of callus formation.  The hardware is intact.  At this point she will continue weightbearing as tolerated.  We do need to still see her back with x-rays in 4 weeks with a repeat 2 views of the left femur.  She understands we may have to x-ray this several times over the next few months to make sure that is heading toward healing appropriately but thus far it does look like it is doing okay.  All questions and concerns were answered and addressed.

## 2020-02-06 ENCOUNTER — Other Ambulatory Visit: Payer: Self-pay | Admitting: Family Medicine

## 2020-02-06 DIAGNOSIS — S7222XD Displaced subtrochanteric fracture of left femur, subsequent encounter for closed fracture with routine healing: Secondary | ICD-10-CM | POA: Diagnosis not present

## 2020-02-06 DIAGNOSIS — M816 Localized osteoporosis [Lequesne]: Secondary | ICD-10-CM

## 2020-02-06 DIAGNOSIS — R200 Anesthesia of skin: Secondary | ICD-10-CM

## 2020-02-06 DIAGNOSIS — M6281 Muscle weakness (generalized): Secondary | ICD-10-CM | POA: Diagnosis not present

## 2020-02-06 DIAGNOSIS — Z9181 History of falling: Secondary | ICD-10-CM | POA: Diagnosis not present

## 2020-02-07 ENCOUNTER — Other Ambulatory Visit: Payer: Self-pay | Admitting: *Deleted

## 2020-02-07 MED ORDER — KETOCONAZOLE 2 % EX CREA
TOPICAL_CREAM | Freq: Two times a day (BID) | CUTANEOUS | 0 refills | Status: DC
Start: 2020-02-07 — End: 2020-10-02

## 2020-02-22 ENCOUNTER — Ambulatory Visit (INDEPENDENT_AMBULATORY_CARE_PROVIDER_SITE_OTHER): Payer: Medicare Other | Admitting: Orthopaedic Surgery

## 2020-02-22 ENCOUNTER — Ambulatory Visit: Payer: Self-pay

## 2020-02-22 ENCOUNTER — Encounter: Payer: Self-pay | Admitting: Orthopaedic Surgery

## 2020-02-22 DIAGNOSIS — S7222XD Displaced subtrochanteric fracture of left femur, subsequent encounter for closed fracture with routine healing: Secondary | ICD-10-CM | POA: Diagnosis not present

## 2020-02-22 NOTE — Progress Notes (Signed)
The patient is now between 9 to 10 weeks status post intramedullary nail placement into the left femur to treat a comminuted subtrochanteric fracture.  She said that she is increase her activities and has minimal pain and is able to walk with a walker at home.  On exam she easily also put her left hip and left knee the range of motion and seems to not be experiencing any pain.  2 views of the left femur show the fracture still visible but there has been interval healing with no evidence of complicating features of the hardware.  She will continue to work on the healing process with weightbearing as tolerated.  She can drop from my standpoint.  We will see her back in 2 months with repeat 2 views of the left femur.  All questions and concerns were answered and addressed.

## 2020-03-05 ENCOUNTER — Other Ambulatory Visit: Payer: Self-pay | Admitting: Family Medicine

## 2020-03-05 DIAGNOSIS — R200 Anesthesia of skin: Secondary | ICD-10-CM

## 2020-03-05 DIAGNOSIS — F339 Major depressive disorder, recurrent, unspecified: Secondary | ICD-10-CM

## 2020-03-06 NOTE — Telephone Encounter (Signed)
Stacks. NTBS 30 days given 02/07/20 

## 2020-03-12 ENCOUNTER — Other Ambulatory Visit: Payer: Self-pay | Admitting: Family Medicine

## 2020-03-13 ENCOUNTER — Other Ambulatory Visit: Payer: Self-pay | Admitting: *Deleted

## 2020-03-13 DIAGNOSIS — L719 Rosacea, unspecified: Secondary | ICD-10-CM

## 2020-03-13 MED ORDER — METRONIDAZOLE 0.75 % EX CREA: TOPICAL_CREAM | Freq: Two times a day (BID) | CUTANEOUS | 5 refills | Status: DC

## 2020-04-02 ENCOUNTER — Other Ambulatory Visit: Payer: Self-pay | Admitting: Family Medicine

## 2020-04-02 DIAGNOSIS — M41115 Juvenile idiopathic scoliosis, thoracolumbar region: Secondary | ICD-10-CM

## 2020-04-02 NOTE — Telephone Encounter (Signed)
Stacks. NTBS 30 days given 02/07/20

## 2020-04-03 ENCOUNTER — Other Ambulatory Visit: Payer: Self-pay

## 2020-04-03 MED ORDER — LOSARTAN POTASSIUM 25 MG PO TABS
25.0000 mg | ORAL_TABLET | Freq: Every day | ORAL | 0 refills | Status: DC
Start: 2020-04-03 — End: 2020-04-04

## 2020-04-03 NOTE — Telephone Encounter (Signed)
  Prescription Request  04/03/2020  What is the name of the medication or equipment? BP Med  Have you contacted your pharmacy to request a refill? (if applicable) Yes  Which pharmacy would you like this sent to? [facility] [place] pt appt to see Dr Livia Snellen tomorrow at 8:25 for med refill but pt says she is leery about driving early in the morning when its so cold out because she is afraid of ice being on the road and she already has to use a walker from where she broke her leg so she would really prefer to have an appt later in the day. Dr Livia Snellen doesn't have any other openings until 05/01/20. Pt wants to know if she can come in that day and Dr Livia Snellen send in refills to last her until she can come that day?    Patient notified that their request is being sent to the clinical staff for review and that they should receive a response within 2 business days.

## 2020-04-04 ENCOUNTER — Ambulatory Visit (INDEPENDENT_AMBULATORY_CARE_PROVIDER_SITE_OTHER): Payer: Medicare Other | Admitting: Family Medicine

## 2020-04-04 ENCOUNTER — Other Ambulatory Visit: Payer: Self-pay

## 2020-04-04 ENCOUNTER — Encounter: Payer: Self-pay | Admitting: Family Medicine

## 2020-04-04 VITALS — BP 159/81 | HR 70 | Temp 96.1°F | Resp 20 | Ht 62.0 in | Wt 154.0 lb

## 2020-04-04 DIAGNOSIS — M41115 Juvenile idiopathic scoliosis, thoracolumbar region: Secondary | ICD-10-CM

## 2020-04-04 DIAGNOSIS — M8589 Other specified disorders of bone density and structure, multiple sites: Secondary | ICD-10-CM

## 2020-04-04 DIAGNOSIS — K219 Gastro-esophageal reflux disease without esophagitis: Secondary | ICD-10-CM | POA: Diagnosis not present

## 2020-04-04 DIAGNOSIS — I10 Essential (primary) hypertension: Secondary | ICD-10-CM

## 2020-04-04 DIAGNOSIS — R200 Anesthesia of skin: Secondary | ICD-10-CM

## 2020-04-04 DIAGNOSIS — J3089 Other allergic rhinitis: Secondary | ICD-10-CM | POA: Diagnosis not present

## 2020-04-04 DIAGNOSIS — F339 Major depressive disorder, recurrent, unspecified: Secondary | ICD-10-CM

## 2020-04-04 MED ORDER — MELOXICAM 15 MG PO TABS: ORAL_TABLET | ORAL | 5 refills | Status: DC

## 2020-04-04 MED ORDER — LEVOCETIRIZINE DIHYDROCHLORIDE 5 MG PO TABS: 5.0000 mg | ORAL_TABLET | Freq: Every evening | ORAL | 3 refills | Status: DC

## 2020-04-04 MED ORDER — CITALOPRAM HYDROBROMIDE 40 MG PO TABS: 60.0000 mg | ORAL_TABLET | Freq: Every day | ORAL | 5 refills | Status: DC

## 2020-04-04 MED ORDER — OMEPRAZOLE 20 MG PO CPDR: 20.0000 mg | DELAYED_RELEASE_CAPSULE | Freq: Every day | ORAL | 3 refills | Status: DC

## 2020-04-04 MED ORDER — ALENDRONATE SODIUM 70 MG PO TABS
70.0000 mg | ORAL_TABLET | ORAL | 1 refills | Status: DC
Start: 2020-04-06 — End: 2021-04-08

## 2020-04-04 MED ORDER — FLUTICASONE PROPIONATE 50 MCG/ACT NA SUSP: 1.0000 | Freq: Every day | NASAL | 11 refills | Status: DC | PRN

## 2020-04-04 MED ORDER — LOSARTAN POTASSIUM 25 MG PO TABS
25.0000 mg | ORAL_TABLET | Freq: Every day | ORAL | 0 refills | Status: DC
Start: 2020-04-04 — End: 2020-04-29

## 2020-04-04 MED ORDER — GABAPENTIN 100 MG PO CAPS: 100.0000 mg | ORAL_CAPSULE | Freq: Two times a day (BID) | ORAL | 2 refills | Status: DC | PRN

## 2020-04-04 NOTE — Progress Notes (Signed)
Subjective:  Patient ID: Savannah Dean, female    DOB: 01/05/1946  Age: 74 y.o. MRN: [id number]  CC: No chief complaint on file.   HPI Savannah Dean presents for  follow-up of hypertension. Patient has no history of headache chest pain or shortness of breath or recent cough. Patient also denies symptoms of TIA such as focal numbness or weakness. Patient denies side effects from medication. States taking it regularly.  Patient in for follow-up of GERD. Currently asymptomatic taking  PPI daily. There is no chest pain or heartburn. No hematemesis and no melena. No dysphagia or choking. Onset is remote. Progression is stable. Complicating factors, none.  Pt. Relates story of scoliosis with repair via rods in the past.    Depression screen [facility] 2/9 04/04/2020 06/29/2019 06/14/2018 11/16/2017 05/14/2017  Decreased Interest 0 0 0 0 0  Down, Depressed, Hopeless 0 0 0 0 0  PHQ - 2 Score 0 0 0 0 0  Altered sleeping - 0 - - -  Tired, decreased energy - 0 - - -  Change in appetite - 0 - - -  Feeling bad or failure about yourself  - 0 - - -  Trouble concentrating - 0 - - -  Moving slowly or fidgety/restless - 0 - - -  Suicidal thoughts - 0 - - -  PHQ-9 Score - 0 - - -  Difficult doing work/chores - Not difficult at all - - -    History Savannah Dean has a past medical history of Arthritis, Depression, HOH (hard of hearing), and Hypertension.   She has a past surgical history that includes Spinal fusion (1950); No past surgeries; and Intramedullary (im) nail intertrochanteric (Left, 12/10/2019).   Her family history includes Diabetes in her brother and maternal grandfather; Hypertension in her father and mother.She reports that she has never smoked. She has never used smokeless tobacco. She reports that she does not drink alcohol and does not use drugs.  Current Outpatient Medications on File Prior to Visit  Medication Sig Dispense Refill  . acetaminophen (TYLENOL) 325 MG tablet Take 1-2 tablets  (325-650 mg total) by mouth every 6 (six) hours as needed for mild pain (pain score 1-3 or temp > 100.5).    Marland Kitchen aspirin 325 MG EC tablet Take 1 tablet (325 mg total) by mouth daily with breakfast. 30 tablet 0  . diclofenac sodium (VOLTAREN) 1 % GEL Apply 4 g topically 4 (four) times daily. 400 g 2  . EPINEPHrine 0.3 mg/0.3 mL IJ SOAJ injection Inject 0.3 mg into the muscle as needed for anaphylaxis.     Marland Kitchen ketoconazole (NIZORAL) 2 % cream Apply topically 2 (two) times daily. 60 g 0  . methocarbamol (ROBAXIN) 500 MG tablet Take 1 tablet (500 mg total) by mouth every 6 (six) hours as needed for muscle spasms.    . metroNIDAZOLE (METROCREAM) 0.75 % cream Apply topically 2 (two) times daily. 45 g 5  . Multiple Vitamin (MULTIVITAMIN WITH MINERALS) TABS tablet Take 1 tablet by mouth daily.    Marland Kitchen neomycin-polymyxin b-dexamethasone (MAXITROL) 3.5-10000-0.1 OINT APPLY THREE TIMES A DAY AS DIRECTED.  Needs to be seen before next refill 3.5 g 0  . PROAIR RESPICLICK 353 (90 Base) MCG/ACT AEPB Inhale 2 puffs into the lungs every 6 (six) hours as needed (for wheezing).     Marland Kitchen tiZANidine (ZANAFLEX) 4 MG tablet Take 1 tablet (4 mg total) by mouth every 8 (eight) hours as needed for muscle spasms. 90 tablet 0  .  docusate sodium (COLACE) 100 MG capsule Take 1 capsule (100 mg total) by mouth 2 (two) times daily. (Patient not taking: Reported on 04/04/2020) 10 capsule 0  . oxyCODONE (OXY IR/ROXICODONE) 5 MG immediate release tablet Take 1 tablet (5 mg total) by mouth every 4 (four) hours as needed for moderate pain or severe pain. (Patient not taking: Reported on 04/04/2020) 30 tablet 0  . polyethylene glycol (MIRALAX / GLYCOLAX) 17 g packet Take 17 g by mouth daily. (Patient not taking: Reported on 04/04/2020) 14 each 0  . senna-docusate (SENOKOT-S) 8.6-50 MG tablet Take 1 tablet by mouth at bedtime. (Patient not taking: Reported on 04/04/2020)     No current facility-administered medications on file prior to visit.     ROS Review of Systems  Constitutional: Negative.   HENT: Negative.   Eyes: Negative for visual disturbance.  Respiratory: Negative for shortness of breath.   Cardiovascular: Negative for chest pain.  Gastrointestinal: Negative for abdominal pain.  Musculoskeletal: Positive for arthralgias and back pain.    Objective:  BP (!) 159/81   Pulse 70   Temp (!) 96.1 F (35.6 C) (Temporal)   Resp 20   Ht '5\' 2"'  (1.575 m)   Wt 154 lb (69.9 kg)   SpO2 93%   BMI 28.17 kg/m   BP Readings from Last 3 Encounters:  04/04/20 (!) 159/81  12/15/19 (!) 137/59  06/29/19 (!) 148/78    Wt Readings from Last 3 Encounters:  04/04/20 154 lb (69.9 kg)  12/09/19 150 lb (68 kg)  06/29/19 157 lb 12.8 oz (71.6 kg)     Physical Exam Constitutional:      General: She is not in acute distress.    Appearance: She is well-developed.  HENT:     Head: Normocephalic and atraumatic.  Eyes:     Conjunctiva/sclera: Conjunctivae normal.     Pupils: Pupils are equal, round, and reactive to light.  Neck:     Thyroid: No thyromegaly.  Cardiovascular:     Rate and Rhythm: Normal rate and regular rhythm.     Heart sounds: Normal heart sounds. No murmur heard.   Pulmonary:     Effort: Pulmonary effort is normal. No respiratory distress.     Breath sounds: Normal breath sounds. No wheezing or rales.  Abdominal:     General: Bowel sounds are normal. There is no distension.     Palpations: Abdomen is soft.     Tenderness: There is no abdominal tenderness.  Musculoskeletal:        General: Normal range of motion.     Cervical back: Normal range of motion and neck supple.  Lymphadenopathy:     Cervical: No cervical adenopathy.  Skin:    General: Skin is warm and dry.  Neurological:     Mental Status: She is alert and oriented to person, place, and time.  Psychiatric:        Behavior: Behavior normal.        Thought Content: Thought content normal.        Judgment: Judgment normal.        Assessment & Plan:   Diagnoses and all orders for this visit:  Essential hypertension -     CBC with Differential/Platelet -     CMP14+EGFR -     Lipid panel  Gastroesophageal reflux disease without esophagitis -     omeprazole (PRILOSEC) 20 MG capsule; Take 1 capsule (20 mg total) by mouth daily. -     CBC with  Differential/Platelet -     CMP14+EGFR -     Lipid panel  Juvenile idiopathic scoliosis of thoracolumbar region -     meloxicam (MOBIC) 15 MG tablet; TAKE ONE (1) TABLET EACH DAY -     CBC with Differential/Platelet -     CMP14+EGFR -     Lipid panel  Non-seasonal allergic rhinitis, unspecified trigger -     levocetirizine (XYZAL) 5 MG tablet; Take 1 tablet (5 mg total) by mouth every evening. -     CBC with Differential/Platelet -     CMP14+EGFR -     Lipid panel  Numbness in feet -     gabapentin (NEURONTIN) 100 MG capsule; Take 1 capsule (100 mg total) by mouth 2 (two) times daily as needed. -     CBC with Differential/Platelet -     CMP14+EGFR -     Lipid panel  Depression, recurrent (HCC) -     citalopram (CELEXA) 40 MG tablet; Take 1.5 tablets (60 mg total) by mouth daily. -     CBC with Differential/Platelet -     CMP14+EGFR -     Lipid panel  Osteopenia of multiple sites -     VITAMIN D 25 Hydroxy (Vit-D Deficiency, Fractures)  Other orders -     losartan (COZAAR) 25 MG tablet; Take 1 tablet (25 mg total) by mouth daily. -     fluticasone (FLONASE) 50 MCG/ACT nasal spray; Place 1 spray into both nostrils daily as needed for allergies. -     alendronate (FOSAMAX) 70 MG tablet; Take 1 tablet (70 mg total) by mouth every Saturday.   Allergies as of 04/04/2020   No Known Allergies     Medication List       Accurate as of April 04, 2020 11:59 PM. If you have any questions, ask your nurse or doctor.        acetaminophen 325 MG tablet Commonly known as: TYLENOL Take 1-2 tablets (325-650 mg total) by mouth every 6 (six) hours as needed  for mild pain (pain score 1-3 or temp > 100.5).   alendronate 70 MG tablet Commonly known as: FOSAMAX Take 1 tablet (70 mg total) by mouth every Saturday.   aspirin 325 MG EC tablet Take 1 tablet (325 mg total) by mouth daily with breakfast.   citalopram 40 MG tablet Commonly known as: CELEXA Take 1.5 tablets (60 mg total) by mouth daily.   diclofenac sodium 1 % Gel Commonly known as: VOLTAREN Apply 4 g topically 4 (four) times daily.   docusate sodium 100 MG capsule Commonly known as: COLACE Take 1 capsule (100 mg total) by mouth 2 (two) times daily.   EPINEPHrine 0.3 mg/0.3 mL Soaj injection Commonly known as: EPI-PEN Inject 0.3 mg into the muscle as needed for anaphylaxis.   fluticasone 50 MCG/ACT nasal spray Commonly known as: FLONASE Place 1 spray into both nostrils daily as needed for allergies.   gabapentin 100 MG capsule Commonly known as: NEURONTIN Take 1 capsule (100 mg total) by mouth 2 (two) times daily as needed. What changed: additional instructions Changed by: Claretta Fraise, MD   ketoconazole 2 % cream Commonly known as: NIZORAL Apply topically 2 (two) times daily.   levocetirizine 5 MG tablet Commonly known as: XYZAL Take 1 tablet (5 mg total) by mouth every evening.   losartan 25 MG tablet Commonly known as: COZAAR Take 1 tablet (25 mg total) by mouth daily. What changed: additional instructions Changed by: Claretta Fraise, MD  meloxicam 15 MG tablet Commonly known as: MOBIC TAKE ONE (1) TABLET EACH DAY   methocarbamol 500 MG tablet Commonly known as: ROBAXIN Take 1 tablet (500 mg total) by mouth every 6 (six) hours as needed for muscle spasms.   metroNIDAZOLE 0.75 % cream Commonly known as: METROCREAM Apply topically 2 (two) times daily.   multivitamin with minerals Tabs tablet Take 1 tablet by mouth daily.   neomycin-polymyxin b-dexamethasone 3.5-10000-0.1 Oint Commonly known as: MAXITROL APPLY THREE TIMES A DAY AS DIRECTED.  Needs  to be seen before next refill   omeprazole 20 MG capsule Commonly known as: PRILOSEC Take 1 capsule (20 mg total) by mouth daily.   oxyCODONE 5 MG immediate release tablet Commonly known as: Oxy IR/ROXICODONE Take 1 tablet (5 mg total) by mouth every 4 (four) hours as needed for moderate pain or severe pain.   polyethylene glycol 17 g packet Commonly known as: MIRALAX / GLYCOLAX Take 17 g by mouth daily.   ProAir RespiClick 062 (90 Base) MCG/ACT Aepb Generic drug: Albuterol Sulfate Inhale 2 puffs into the lungs every 6 (six) hours as needed (for wheezing).   senna-docusate 8.6-50 MG tablet Commonly known as: Senokot-S Take 1 tablet by mouth at bedtime.   tiZANidine 4 MG tablet Commonly known as: ZANAFLEX Take 1 tablet (4 mg total) by mouth every 8 (eight) hours as needed for muscle spasms.       Meds ordered this encounter  Medications  . omeprazole (PRILOSEC) 20 MG capsule    Sig: Take 1 capsule (20 mg total) by mouth daily.    Dispense:  90 capsule    Refill:  3  . meloxicam (MOBIC) 15 MG tablet    Sig: TAKE ONE (1) TABLET EACH DAY    Dispense:  30 tablet    Refill:  5  . losartan (COZAAR) 25 MG tablet    Sig: Take 1 tablet (25 mg total) by mouth daily.    Dispense:  90 tablet    Refill:  0  . levocetirizine (XYZAL) 5 MG tablet    Sig: Take 1 tablet (5 mg total) by mouth every evening.    Dispense:  90 tablet    Refill:  3  . gabapentin (NEURONTIN) 100 MG capsule    Sig: Take 1 capsule (100 mg total) by mouth 2 (two) times daily as needed.    Dispense:  60 capsule    Refill:  2  . fluticasone (FLONASE) 50 MCG/ACT nasal spray    Sig: Place 1 spray into both nostrils daily as needed for allergies.    Dispense:  16 g    Refill:  11  . citalopram (CELEXA) 40 MG tablet    Sig: Take 1.5 tablets (60 mg total) by mouth daily.    Dispense:  45 tablet    Refill:  5  . alendronate (FOSAMAX) 70 MG tablet    Sig: Take 1 tablet (70 mg total) by mouth every Saturday.     Dispense:  12 tablet    Refill:  1      Follow-up: Return in about 6 months (around 10/02/2020).  Claretta Fraise, M.D.

## 2020-04-05 LAB — CMP14+EGFR
ALT: 10 IU/L (ref 0–32)
AST: 22 IU/L (ref 0–40)
Albumin/Globulin Ratio: 1.9 (ref 1.2–2.2)
Albumin: 4.7 g/dL (ref 3.7–4.7)
Alkaline Phosphatase: 104 IU/L (ref 44–121)
BUN/Creatinine Ratio: 15 (ref 12–28)
BUN: 12 mg/dL (ref 8–27)
Bilirubin Total: 0.2 mg/dL (ref 0.0–1.2)
CO2: 26 mmol/L (ref 20–29)
Calcium: 10 mg/dL (ref 8.7–10.3)
Chloride: 101 mmol/L (ref 96–106)
Creatinine, Ser: 0.82 mg/dL (ref 0.57–1.00)
GFR calc Af Amer: 80 mL/min/{1.73_m2} (ref >59)
GFR calc non Af Amer: 70 mL/min/{1.73_m2} (ref >59)
Globulin, Total: 2.5 g/dL (ref 1.5–4.5)
Glucose: 81 mg/dL (ref 65–99)
Potassium: 4.3 mmol/L (ref 3.5–5.2)
Sodium: 141 mmol/L (ref 134–144)
Total Protein: 7.2 g/dL (ref 6.0–8.5)

## 2020-04-05 LAB — CBC WITH DIFFERENTIAL/PLATELET
Basophils Absolute: 0.1 10*3/uL (ref 0.0–0.2)
Basos: 1 %
EOS (ABSOLUTE): 0.2 10*3/uL (ref 0.0–0.4)
Eos: 4 %
Hematocrit: 39.2 % (ref 34.0–46.6)
Hemoglobin: 13.1 g/dL (ref 11.1–15.9)
Immature Grans (Abs): 0 10*3/uL (ref 0.0–0.1)
Immature Granulocytes: 0 %
Lymphocytes Absolute: 2.1 10*3/uL (ref 0.7–3.1)
Lymphs: 35 %
MCH: 29 pg (ref 26.6–33.0)
MCHC: 33.4 g/dL (ref 31.5–35.7)
MCV: 87 fL (ref 79–97)
Monocytes Absolute: 0.7 10*3/uL (ref 0.1–0.9)
Monocytes: 12 %
Neutrophils Absolute: 2.8 10*3/uL (ref 1.4–7.0)
Neutrophils: 48 %
Platelets: 202 10*3/uL (ref 150–450)
RBC: 4.51 x10E6/uL (ref 3.77–5.28)
RDW: 13.1 % (ref 11.7–15.4)
WBC: 5.8 10*3/uL (ref 3.4–10.8)

## 2020-04-05 LAB — LIPID PANEL
Chol/HDL Ratio: 3.9 ratio (ref 0.0–4.4)
Cholesterol, Total: 201 mg/dL — ABNORMAL HIGH (ref 100–199)
HDL: 52 mg/dL (ref >39)
LDL Chol Calc (NIH): 122 mg/dL — ABNORMAL HIGH (ref 0–99)
Triglycerides: 150 mg/dL — ABNORMAL HIGH (ref 0–149)
VLDL Cholesterol Cal: 27 mg/dL (ref 5–40)

## 2020-04-05 LAB — VITAMIN D 25 HYDROXY (VIT D DEFICIENCY, FRACTURES): Vit D, 25-Hydroxy: 41.3 ng/mL (ref 30.0–100.0)

## 2020-04-08 ENCOUNTER — Telehealth: Payer: Self-pay | Admitting: Family Medicine

## 2020-04-08 NOTE — Telephone Encounter (Signed)
Patient aware of lab results. Verbal understanding

## 2020-04-11 ENCOUNTER — Other Ambulatory Visit: Payer: Self-pay

## 2020-04-11 DIAGNOSIS — Z780 Asymptomatic menopausal state: Secondary | ICD-10-CM

## 2020-04-11 DIAGNOSIS — S72002A Fracture of unspecified part of neck of left femur, initial encounter for closed fracture: Secondary | ICD-10-CM

## 2020-04-11 DIAGNOSIS — M816 Localized osteoporosis [Lequesne]: Secondary | ICD-10-CM

## 2020-04-11 DIAGNOSIS — Z8781 Personal history of (healed) traumatic fracture: Secondary | ICD-10-CM

## 2020-04-11 NOTE — Progress Notes (Signed)
Dg wrf ?

## 2020-04-15 ENCOUNTER — Other Ambulatory Visit: Payer: Self-pay | Admitting: Family Medicine

## 2020-04-15 DIAGNOSIS — M41115 Juvenile idiopathic scoliosis, thoracolumbar region: Secondary | ICD-10-CM

## 2020-04-24 ENCOUNTER — Encounter: Payer: Self-pay | Admitting: Orthopaedic Surgery

## 2020-04-24 ENCOUNTER — Ambulatory Visit (INDEPENDENT_AMBULATORY_CARE_PROVIDER_SITE_OTHER): Payer: Medicare Other

## 2020-04-24 ENCOUNTER — Ambulatory Visit (INDEPENDENT_AMBULATORY_CARE_PROVIDER_SITE_OTHER): Payer: Medicare Other | Admitting: Orthopaedic Surgery

## 2020-04-24 DIAGNOSIS — S7222XK Displaced subtrochanteric fracture of left femur, subsequent encounter for closed fracture with nonunion: Secondary | ICD-10-CM | POA: Diagnosis not present

## 2020-04-24 DIAGNOSIS — S7222XD Displaced subtrochanteric fracture of left femur, subsequent encounter for closed fracture with routine healing: Secondary | ICD-10-CM

## 2020-04-24 HISTORY — DX: Displaced subtrochanteric fracture of left femur, subsequent encounter for closed fracture with nonunion: S72.22XK

## 2020-04-24 NOTE — Progress Notes (Signed)
The patient is now over 4 months status post sustaining a left proximal femur subtrochanteric fracture.  She underwent open reduction/internal fixation of this fracture with a long intramedullary rod in the hip screw on December 10, 2018.  She still ambulates with a walker.  She feels like she is getting stronger.  She does report some just mild left thigh pain.  On exam I can put her left hip through internal and external rotation and move her knee easily.  Her pain is only mild at the proximal femur.  X-rays of her left femur today show the fracture is still visible and it shows evidence of a nonunion at now over 4 months since surgery.  I relayed the findings of the patient's x-rays to the patient.  I do feel it is worthwhile trying an ultrasound bone stimulator to see if that will help with bone healing of her left subtrochanteric femur area.  We will work on getting that set up.  I would like to repeat x-rays in 2 months of the left femur.  She will continue her walker for now and continue to work on strengthening her hip and thigh.

## 2020-04-29 ENCOUNTER — Other Ambulatory Visit: Payer: Self-pay | Admitting: Family Medicine

## 2020-05-13 DIAGNOSIS — S7222XK Displaced subtrochanteric fracture of left femur, subsequent encounter for closed fracture with nonunion: Secondary | ICD-10-CM | POA: Diagnosis not present

## 2020-06-19 ENCOUNTER — Other Ambulatory Visit: Payer: Self-pay | Admitting: Family Medicine

## 2020-06-19 DIAGNOSIS — R200 Anesthesia of skin: Secondary | ICD-10-CM

## 2020-06-24 ENCOUNTER — Encounter: Payer: Self-pay | Admitting: Orthopaedic Surgery

## 2020-06-24 ENCOUNTER — Ambulatory Visit: Payer: Medicare Other | Admitting: Orthopaedic Surgery

## 2020-06-24 ENCOUNTER — Ambulatory Visit: Payer: Self-pay

## 2020-06-24 DIAGNOSIS — S7222XD Displaced subtrochanteric fracture of left femur, subsequent encounter for closed fracture with routine healing: Secondary | ICD-10-CM | POA: Diagnosis not present

## 2020-06-24 MED ORDER — METHOCARBAMOL 500 MG PO TABS: 500.0000 mg | ORAL_TABLET | Freq: Four times a day (QID) | ORAL | 1 refills | Status: DC | PRN

## 2020-06-24 NOTE — Progress Notes (Signed)
The patient is now 6 months status post open reduction/internal fixation of a complex left femur subtrochanteric fracture.  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  She denies any pain at all with the femur or hip.  She is reporting right-sided rib pain and does have known scoliosis.  She is 75 years old and mobilizes minimally.  She does report that she has a remote history of scoliosis surgery.  She has never had any type of abdominal surgery.  I put her left hip through internal/external rotation as well as flexion extension of her left knee.  I stressed the fracture site of her left femur and stressed the femur in general and she is asymptomatic in terms of pain.  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  There has been slight interval healing.  I talked her about the possibility of surgery on her left femur.  Since she is asymptomatic she is opposed to this and certainly I agree with the fact that there is no hardware failure evidence or pain that she is experiencing.  She is a minimal ambulator.  I will have her continue bone stimulator and would like to see her back in 3 months with a repeat 2 views of her left femur.  If there is issues before then she will let us know.  I will send in some Robaxin for her right sided spasms and recommend that she see her primary care physician because this certainly could be a gallbladder issue if it persists.

## 2020-06-27 ENCOUNTER — Other Ambulatory Visit: Payer: Self-pay | Admitting: Family Medicine

## 2020-06-27 DIAGNOSIS — M41115 Juvenile idiopathic scoliosis, thoracolumbar region: Secondary | ICD-10-CM

## 2020-09-17 ENCOUNTER — Other Ambulatory Visit: Payer: Self-pay | Admitting: Family Medicine

## 2020-09-17 DIAGNOSIS — M41115 Juvenile idiopathic scoliosis, thoracolumbar region: Secondary | ICD-10-CM

## 2020-09-17 NOTE — Telephone Encounter (Signed)
Last office visit 04/04/20 Last refill 06/28/20, #90, no refills

## 2020-09-20 ENCOUNTER — Other Ambulatory Visit: Payer: Self-pay | Admitting: Family Medicine

## 2020-09-20 DIAGNOSIS — F339 Major depressive disorder, recurrent, unspecified: Secondary | ICD-10-CM

## 2020-09-23 ENCOUNTER — Ambulatory Visit: Payer: Self-pay

## 2020-09-23 ENCOUNTER — Ambulatory Visit: Payer: Medicare Other | Admitting: Orthopaedic Surgery

## 2020-09-23 ENCOUNTER — Encounter: Payer: Self-pay | Admitting: Orthopaedic Surgery

## 2020-09-23 DIAGNOSIS — S7222XD Displaced subtrochanteric fracture of left femur, subsequent encounter for closed fracture with routine healing: Secondary | ICD-10-CM | POA: Diagnosis not present

## 2020-09-23 NOTE — Progress Notes (Signed)
The patient is now just over 9 months status post fixation of a complex proximal third femur fracture using intramedullary rod and hip screw construct.  She is 75 years old.  At her last visit in January there was concerned about a nonunion.  There is no evidence of hardware failure.  She denies any left thigh or lower extremity pain.  She usually ambulates with a walker at home.  She has been denying any type of left thigh or femur pain.  There has been no acute change in her medical status.  She says she is able to get around fine except for her severe scoliosis in her back.  She has to wear a lift in her left shoe due to that.  Her mobility is significantly limited because of her spine issues.  I can easily put her left hip and knee through range of motion and stress her left thigh.  She denies any pain at all.  NuPrep x-rays of the left show there is been significant interval healing of the fracture but there is still some lucency concerning for nonunion.  She does report she is using the bone stimulator daily.  Again she denies any pain and does not want any further treatment done since she is pain-free.  I agree with this since she is not having any issues.  However I stressed to her that if she develops any, thigh pain or any worsening issues to then let us know and we would need to see her in the office.  All question concerns were answered and addressed.

## 2020-09-25 ENCOUNTER — Other Ambulatory Visit: Payer: Self-pay | Admitting: Family Medicine

## 2020-09-25 DIAGNOSIS — R200 Anesthesia of skin: Secondary | ICD-10-CM

## 2020-10-02 ENCOUNTER — Other Ambulatory Visit: Payer: Self-pay

## 2020-10-02 ENCOUNTER — Ambulatory Visit: Payer: Medicare Other

## 2020-10-02 ENCOUNTER — Ambulatory Visit (INDEPENDENT_AMBULATORY_CARE_PROVIDER_SITE_OTHER): Payer: Medicare Other | Admitting: Family Medicine

## 2020-10-02 ENCOUNTER — Encounter: Payer: Self-pay | Admitting: Family Medicine

## 2020-10-02 VITALS — BP 144/71 | HR 65 | Temp 98.0°F | Ht 62.0 in | Wt 152.2 lb

## 2020-10-02 DIAGNOSIS — I10 Essential (primary) hypertension: Secondary | ICD-10-CM | POA: Diagnosis not present

## 2020-10-02 DIAGNOSIS — E782 Mixed hyperlipidemia: Secondary | ICD-10-CM | POA: Diagnosis not present

## 2020-10-02 MED ORDER — KETOCONAZOLE 2 % EX CREA: TOPICAL_CREAM | Freq: Two times a day (BID) | CUTANEOUS | 0 refills | Status: DC

## 2020-10-02 MED ORDER — NEOMYCIN-POLYMYXIN-DEXAMETH 3.5-10000-0.1 OP OINT: TOPICAL_OINTMENT | OPHTHALMIC | 0 refills | Status: DC

## 2020-10-02 MED ORDER — VALSARTAN 160 MG PO TABS: 160.0000 mg | ORAL_TABLET | Freq: Every day | ORAL | 1 refills | Status: DC

## 2020-10-02 NOTE — Progress Notes (Signed)
Subjective:  Patient ID: Savannah Dean, female    DOB: [birth year]  Age: 75 y.o. MRN: [id number]  CC: Medical Management of Chronic Issues   HPI DENELL COTHERN presents for follow-up of hypertension. Patient has no history of headache chest pain or shortness of breath or recent cough. Patient also denies symptoms of TIA such as numbness weakness lateralizing. Patient checks  blood pressure at home and has not had any elevated readings recently. Patient denies side effects from his medication. States taking it regularly. Patient in for follow-up of elevated cholesterol. Doing well without complaints on current medication. Denies side effects of statin including myalgia and arthralgia and nausea. Also in today for liver function testing. Currently no chest pain, shortness of breath or other cardiovascular related symptoms noted.  Depression screen [facility] 2/9 10/02/2020 10/02/2020 04/04/2020  Decreased Interest 0 0 0  Down, Depressed, Hopeless 0 0 0  PHQ - 2 Score 0 0 0  Altered sleeping 0 - -  Tired, decreased energy 0 - -  Change in appetite 0 - -  Feeling bad or failure about yourself  0 - -  Trouble concentrating 0 - -  Moving slowly or fidgety/restless 0 - -  Suicidal thoughts 0 - -  PHQ-9 Score 0 - -  Difficult doing work/chores Not difficult at all - -    History Dellamae has a past medical history of Arthritis, Depression, HOH (hard of hearing), and Hypertension.   She has a past surgical history that includes Spinal fusion (1950); No past surgeries; and Intramedullary (im) nail intertrochanteric (Left, 12/10/2019).   Her family history includes Diabetes in her brother and maternal grandfather; Hypertension in her father and mother.She reports that she has never smoked. She has never used smokeless tobacco. She reports that she does not drink alcohol and does not use drugs.    ROS Review of Systems  Constitutional: Negative.   HENT: Negative.   Eyes: Negative for visual  disturbance.  Respiratory: Negative for shortness of breath.   Cardiovascular: Negative for chest pain.  Gastrointestinal: Negative for abdominal pain.  Musculoskeletal: Negative for arthralgias.    Objective:  BP (!) 144/71   Pulse 65   Temp 98 F (36.7 C)   Ht '5\' 2"'  (1.575 m)   Wt 152 lb 3.2 oz (69 kg)   SpO2 95%   BMI 27.84 kg/m   BP Readings from Last 3 Encounters:  10/02/20 (!) 144/71  04/04/20 (!) 159/81  12/15/19 (!) 137/59    Wt Readings from Last 3 Encounters:  10/02/20 152 lb 3.2 oz (69 kg)  04/04/20 154 lb (69.9 kg)  12/09/19 150 lb (68 kg)     Physical Exam Constitutional:      General: She is not in acute distress.    Appearance: She is well-developed.  HENT:     Head: Normocephalic and atraumatic.  Eyes:     Conjunctiva/sclera: Conjunctivae normal.     Pupils: Pupils are equal, round, and reactive to light.  Neck:     Thyroid: No thyromegaly.  Cardiovascular:     Rate and Rhythm: Normal rate and regular rhythm.     Heart sounds: Normal heart sounds. No murmur heard.   Pulmonary:     Effort: Pulmonary effort is normal. No respiratory distress.     Breath sounds: Normal breath sounds. No wheezing or rales.  Abdominal:     General: Bowel sounds are normal. There is no distension.     Palpations: Abdomen is  soft.     Tenderness: There is no abdominal tenderness.  Musculoskeletal:        General: Normal range of motion.     Cervical back: Normal range of motion and neck supple.  Lymphadenopathy:     Cervical: No cervical adenopathy.  Skin:    General: Skin is warm and dry.  Neurological:     Mental Status: She is alert and oriented to person, place, and time.  Psychiatric:        Behavior: Behavior normal.        Thought Content: Thought content normal.        Judgment: Judgment normal.       Assessment & Plan:   Tanyla was seen today for medical management of chronic issues.  Diagnoses and all orders for this visit:  Essential  hypertension -     CBC with Differential/Platelet -     CMP14+EGFR -     Lipid panel  Mixed hyperlipidemia -     CBC with Differential/Platelet -     CMP14+EGFR -     Lipid panel  Other orders -     ketoconazole (NIZORAL) 2 % cream; Apply topically 2 (two) times daily. -     neomycin-polymyxin b-dexamethasone (MAXITROL) 3.5-10000-0.1 OINT; APPLY THREE TIMES A DAY AS DIRECTED.  Needs to be seen before next refill -     valsartan (DIOVAN) 160 MG tablet; Take 1 tablet (160 mg total) by mouth daily.       I have changed Shannette Tabares. Supan's losartan to valsartan. I am also having her maintain her diclofenac sodium, EPINEPHrine, ProAir RespiClick, aspirin, oxyCODONE, multivitamin with minerals, docusate sodium, polyethylene glycol, senna-docusate, acetaminophen, metroNIDAZOLE, omeprazole, meloxicam, levocetirizine, fluticasone, alendronate, methocarbamol, tiZANidine, citalopram, gabapentin, ketoconazole, and neomycin-polymyxin b-dexamethasone.  Allergies as of 10/02/2020   No Known Allergies     Medication List       Accurate as of Oct 02, 2020  1:45 PM. If you have any questions, ask your nurse or doctor.        STOP taking these medications   losartan 25 MG tablet Commonly known as: COZAAR Replaced by: valsartan 160 MG tablet Stopped by: Claretta Fraise, MD     TAKE these medications   acetaminophen 325 MG tablet Commonly known as: TYLENOL Take 1-2 tablets (325-650 mg total) by mouth every 6 (six) hours as needed for mild pain (pain score 1-3 or temp > 100.5).   alendronate 70 MG tablet Commonly known as: FOSAMAX Take 1 tablet (70 mg total) by mouth every Saturday.   aspirin 325 MG EC tablet Take 1 tablet (325 mg total) by mouth daily with breakfast.   citalopram 40 MG tablet Commonly known as: CELEXA TAKE 1 AND 1/2 TABLETS DAILY   diclofenac sodium 1 % Gel Commonly known as: VOLTAREN Apply 4 g topically 4 (four) times daily.   docusate sodium 100 MG  capsule Commonly known as: COLACE Take 1 capsule (100 mg total) by mouth 2 (two) times daily.   EPINEPHrine 0.3 mg/0.3 mL Soaj injection Commonly known as: EPI-PEN Inject 0.3 mg into the muscle as needed for anaphylaxis.   fluticasone 50 MCG/ACT nasal spray Commonly known as: FLONASE Place 1 spray into both nostrils daily as needed for allergies.   gabapentin 100 MG capsule Commonly known as: NEURONTIN TAKE ONE CAPSULE BY MOUTH TWICE DAILY AS NEEDED   ketoconazole 2 % cream Commonly known as: NIZORAL Apply topically 2 (two) times daily.   levocetirizine 5 MG tablet  Commonly known as: XYZAL Take 1 tablet (5 mg total) by mouth every evening.   meloxicam 15 MG tablet Commonly known as: MOBIC TAKE ONE (1) TABLET EACH DAY   methocarbamol 500 MG tablet Commonly known as: ROBAXIN Take 1 tablet (500 mg total) by mouth every 6 (six) hours as needed.   metroNIDAZOLE 0.75 % cream Commonly known as: METROCREAM Apply topically 2 (two) times daily.   multivitamin with minerals Tabs tablet Take 1 tablet by mouth daily.   neomycin-polymyxin b-dexamethasone 3.5-10000-0.1 Oint Commonly known as: MAXITROL APPLY THREE TIMES A DAY AS DIRECTED.  Needs to be seen before next refill   omeprazole 20 MG capsule Commonly known as: PRILOSEC Take 1 capsule (20 mg total) by mouth daily.   oxyCODONE 5 MG immediate release tablet Commonly known as: Oxy IR/ROXICODONE Take 1 tablet (5 mg total) by mouth every 4 (four) hours as needed for moderate pain or severe pain.   polyethylene glycol 17 g packet Commonly known as: MIRALAX / GLYCOLAX Take 17 g by mouth daily.   ProAir RespiClick 530 (90 Base) MCG/ACT Aepb Generic drug: Albuterol Sulfate Inhale 2 puffs into the lungs every 6 (six) hours as needed (for wheezing).   senna-docusate 8.6-50 MG tablet Commonly known as: Senokot-S Take 1 tablet by mouth at bedtime.   tiZANidine 4 MG tablet Commonly known as: ZANAFLEX TAKE 1 TABLET EVERY 8  HOURS AS NEEDED FOR MUSCLE SPASMS   valsartan 160 MG tablet Commonly known as: DIOVAN Take 1 tablet (160 mg total) by mouth daily. Replaces: losartan 25 MG tablet Started by: Claretta Fraise, MD        Follow-up: Return in about 6 months (around 04/04/2021).  Claretta Fraise, M.D.

## 2020-10-03 LAB — CMP14+EGFR
ALT: 12 IU/L (ref 0–32)
AST: 23 IU/L (ref 0–40)
Albumin/Globulin Ratio: 2.3 — ABNORMAL HIGH (ref 1.2–2.2)
Albumin: 4.6 g/dL (ref 3.7–4.7)
Alkaline Phosphatase: 84 IU/L (ref 44–121)
BUN/Creatinine Ratio: 14 (ref 12–28)
BUN: 12 mg/dL (ref 8–27)
Bilirubin Total: 0.4 mg/dL (ref 0.0–1.2)
CO2: 24 mmol/L (ref 20–29)
Calcium: 10.3 mg/dL (ref 8.7–10.3)
Chloride: 102 mmol/L (ref 96–106)
Creatinine, Ser: 0.83 mg/dL (ref 0.57–1.00)
Globulin, Total: 2 g/dL (ref 1.5–4.5)
Glucose: 82 mg/dL (ref 65–99)
Potassium: 4.4 mmol/L (ref 3.5–5.2)
Sodium: 141 mmol/L (ref 134–144)
Total Protein: 6.6 g/dL (ref 6.0–8.5)
eGFR: 73 mL/min/{1.73_m2} (ref >59)

## 2020-10-03 LAB — CBC WITH DIFFERENTIAL/PLATELET
Basophils Absolute: 0.1 10*3/uL (ref 0.0–0.2)
Basos: 1 %
EOS (ABSOLUTE): 0.2 10*3/uL (ref 0.0–0.4)
Eos: 3 %
Hematocrit: 36.9 % (ref 34.0–46.6)
Hemoglobin: 12.4 g/dL (ref 11.1–15.9)
Immature Grans (Abs): 0 10*3/uL (ref 0.0–0.1)
Immature Granulocytes: 0 %
Lymphocytes Absolute: 2 10*3/uL (ref 0.7–3.1)
Lymphs: 30 %
MCH: 30.1 pg (ref 26.6–33.0)
MCHC: 33.6 g/dL (ref 31.5–35.7)
MCV: 90 fL (ref 79–97)
Monocytes Absolute: 0.6 10*3/uL (ref 0.1–0.9)
Monocytes: 9 %
Neutrophils Absolute: 3.8 10*3/uL (ref 1.4–7.0)
Neutrophils: 57 %
Platelets: 201 10*3/uL (ref 150–450)
RBC: 4.12 x10E6/uL (ref 3.77–5.28)
RDW: 12.7 % (ref 11.7–15.4)
WBC: 6.6 10*3/uL (ref 3.4–10.8)

## 2020-10-03 LAB — LIPID PANEL
Chol/HDL Ratio: 3.5 ratio (ref 0.0–4.4)
Cholesterol, Total: 177 mg/dL (ref 100–199)
HDL: 51 mg/dL (ref >39)
LDL Chol Calc (NIH): 97 mg/dL (ref 0–99)
Triglycerides: 165 mg/dL — ABNORMAL HIGH (ref 0–149)
VLDL Cholesterol Cal: 29 mg/dL (ref 5–40)

## 2020-10-11 ENCOUNTER — Telehealth: Payer: Self-pay

## 2020-10-11 NOTE — Telephone Encounter (Signed)
Called patient to sch DXA scan and patient asked for lab results - they are in the chart but there is no note from Dr. Livia Snellen. Patient is okaying waiting until he is back Monday - please review and advise

## 2020-10-16 ENCOUNTER — Other Ambulatory Visit: Payer: Self-pay | Admitting: Family Medicine

## 2020-10-16 DIAGNOSIS — M41115 Juvenile idiopathic scoliosis, thoracolumbar region: Secondary | ICD-10-CM

## 2020-10-28 ENCOUNTER — Other Ambulatory Visit: Payer: Self-pay | Admitting: Family Medicine

## 2020-10-28 DIAGNOSIS — F339 Major depressive disorder, recurrent, unspecified: Secondary | ICD-10-CM

## 2020-10-28 DIAGNOSIS — R200 Anesthesia of skin: Secondary | ICD-10-CM

## 2020-12-11 ENCOUNTER — Other Ambulatory Visit: Payer: Self-pay | Admitting: Family Medicine

## 2020-12-11 DIAGNOSIS — M41115 Juvenile idiopathic scoliosis, thoracolumbar region: Secondary | ICD-10-CM

## 2020-12-27 ENCOUNTER — Other Ambulatory Visit: Payer: Self-pay | Admitting: Family Medicine

## 2021-01-23 ENCOUNTER — Other Ambulatory Visit: Payer: Self-pay | Admitting: Family Medicine

## 2021-01-23 DIAGNOSIS — F339 Major depressive disorder, recurrent, unspecified: Secondary | ICD-10-CM

## 2021-01-23 DIAGNOSIS — R200 Anesthesia of skin: Secondary | ICD-10-CM

## 2021-02-21 ENCOUNTER — Ambulatory Visit (INDEPENDENT_AMBULATORY_CARE_PROVIDER_SITE_OTHER): Payer: Medicare Other

## 2021-02-21 ENCOUNTER — Other Ambulatory Visit: Payer: Self-pay | Admitting: Family Medicine

## 2021-02-21 VITALS — Ht 62.0 in | Wt 152.0 lb

## 2021-02-21 DIAGNOSIS — K219 Gastro-esophageal reflux disease without esophagitis: Secondary | ICD-10-CM

## 2021-02-21 DIAGNOSIS — Z0000 Encounter for general adult medical examination without abnormal findings: Secondary | ICD-10-CM

## 2021-02-21 NOTE — Progress Notes (Signed)
Subjective:   Savannah Dean is a 75 y.o. female who presents for Medicare Annual (Subsequent) preventive examination.  Virtual Visit via Telephone Note  I connected with  Savannah Dean on 02/21/21 at  9:00 AM EDT by telephone and verified that I am speaking with the correct person using two identifiers.  Location: Patient: Home Provider: [facility] Persons participating in the virtual visit: patient/Nurse Health Advisor   I discussed the limitations, risks, security and privacy concerns of performing an evaluation and management service by telephone and the availability of in person appointments. The patient expressed understanding and agreed to proceed.  Interactive audio and video telecommunications were attempted between this nurse and patient, however failed, due to patient having technical difficulties OR patient did not have access to video capability.  We continued and completed visit with audio only.  Some vital signs may be absent or patient reported.   Brihanna Devenport E Marland Reine, LPN   Review of Systems     Cardiac Risk Factors include: advanced age (>11men, >1 women);dyslipidemia;hypertension;sedentary lifestyle     Objective:    Today's Vitals   02/21/21 0859  Weight: 152 lb (68.9 kg)  Height: 5\' 2"  (1.575 m)   Body mass index is 27.8 kg/m.  Advanced Directives 02/21/2021 12/12/2019 12/12/2019 12/09/2019  Does Patient Have a Medical Advance Directive? No Yes - Yes  Type of Advance Directive - Living will - -  Does patient want to make changes to medical advance directive? - No - Patient declined No - Patient declined No - Patient declined  Would patient like information on creating a medical advance directive? No - Patient declined - - -    Current Medications (verified) Outpatient Encounter Medications as of 02/21/2021  Medication Sig   acetaminophen (TYLENOL) 325 MG tablet Take 1-2 tablets (325-650 mg total) by mouth every 6 (six) hours as needed for mild pain (pain  score 1-3 or temp > 100.5).   alendronate (FOSAMAX) 70 MG tablet Take 1 tablet (70 mg total) by mouth every Saturday.   aspirin 325 MG EC tablet Take 1 tablet (325 mg total) by mouth daily with breakfast.   chlorhexidine (PERIDEX) 0.12 % solution SMARTSIG:By Mouth   citalopram (CELEXA) 40 MG tablet TAKE 1 AND 1/2 TABLETS DAILY   diclofenac sodium (VOLTAREN) 1 % GEL Apply 4 g topically 4 (four) times daily.   docusate sodium (COLACE) 100 MG capsule Take 1 capsule (100 mg total) by mouth 2 (two) times daily.   EPINEPHrine 0.3 mg/0.3 mL IJ SOAJ injection Inject 0.3 mg into the muscle as needed for anaphylaxis.    fluticasone (FLONASE) 50 MCG/ACT nasal spray Place 1 spray into both nostrils daily as needed for allergies.   gabapentin (NEURONTIN) 100 MG capsule TAKE ONE CAPSULE BY MOUTH TWICE DAILY AS NEEDED   ketoconazole (NIZORAL) 2 % cream Apply topically 2 (two) times daily.   levocetirizine (XYZAL) 5 MG tablet Take 1 tablet (5 mg total) by mouth every evening.   meloxicam (MOBIC) 15 MG tablet TAKE ONE (1) TABLET EACH DAY   methocarbamol (ROBAXIN) 500 MG tablet Take 1 tablet (500 mg total) by mouth every 6 (six) hours as needed.   metroNIDAZOLE (METROCREAM) 0.75 % cream Apply topically 2 (two) times daily.   Multiple Vitamin (MULTIVITAMIN WITH MINERALS) TABS tablet Take 1 tablet by mouth daily.   neomycin-polymyxin b-dexamethasone (MAXITROL) 3.5-10000-0.1 OINT APPLY THREE TIMES A DAY AS DIRECTED.  Needs to be seen before next refill   omeprazole (PRILOSEC) 20 MG  capsule Take 1 capsule (20 mg total) by mouth daily.   polyethylene glycol (MIRALAX / GLYCOLAX) 17 g packet Take 17 g by mouth daily.   PROAIR RESPICLICK 347 (90 Base) MCG/ACT AEPB Inhale 2 puffs into the lungs every 6 (six) hours as needed (for wheezing).    senna-docusate (SENOKOT-S) 8.6-50 MG tablet Take 1 tablet by mouth at bedtime.   tiZANidine (ZANAFLEX) 4 MG tablet TAKE 1 TABLET EVERY 8 HOURS AS NEEDED FOR MUSCLE SPASMS    valsartan (DIOVAN) 160 MG tablet TAKE ONE (1) TABLET BY MOUTH EVERY DAY   No facility-administered encounter medications on file as of 02/21/2021.    Allergies (verified) Molds & smuts   History: Past Medical History:  Diagnosis Date   Arthritis    Depression    HOH (hard of hearing)    Hypertension    Past Surgical History:  Procedure Laterality Date   INTRAMEDULLARY (IM) NAIL INTERTROCHANTERIC Left 12/10/2019   Procedure: INTRAMEDULLARY (IM) NAIL SUBTROCHANTRIC;  Surgeon: Mcarthur Rossetti, MD;  Location: [facility];  Service: Orthopedics;  Laterality: Left;   NO PAST SURGERIES     SPINAL FUSION  1950   Family History  Problem Relation Age of Onset   Hypertension Mother    Hypertension Father    Diabetes Brother    Diabetes Maternal Grandfather    Social History   Socioeconomic History   Marital status: Divorced    Spouse name: Not on file   Number of children: 2   Years of education: Not on file   Highest education level: Not on file  Occupational History   Occupation: retired  Tobacco Use   Smoking status: Never   Smokeless tobacco: Never  Vaping Use   Vaping Use: Never used  Substance and Sexual Activity   Alcohol use: Never   Drug use: Never   Sexual activity: Not on file  Other Topics Concern   Not on file  Social History Narrative   ** Merged History Encounter ** Lives alone in an apartment with elevator   Has 2 daughters - both live out of state   Granddaughter lives nearby.    Social Determinants of Health   Financial Resource Strain: Low Risk    Difficulty of Paying Living Expenses: Not hard at all  Food Insecurity: No Food Insecurity   Worried About Charity fundraiser in the Last Year: Never true   Josephine in the Last Year: Never true  Transportation Needs: No Transportation Needs   Lack of Transportation (Medical): No   Lack of Transportation (Non-Medical): No  Physical Activity: Insufficiently Active   Days of Exercise per Week:  7 days   Minutes of Exercise per Session: 20 min  Stress: No Stress Concern Present   Feeling of Stress : Not at all  Social Connections: Moderately Integrated   Frequency of Communication with Friends and Family: More than three times a week   Frequency of Social Gatherings with Friends and Family: More than three times a week   Attends Religious Services: More than 4 times per year   Active Member of Clubs or Organizations: Yes   Attends Music therapist: More than 4 times per year   Marital Status: Divorced    Tobacco Counseling Counseling given: Not Answered   Clinical Intake:  Pre-visit preparation completed: Yes  Pain : No/denies pain     BMI - recorded: 27.8 Nutritional Status: BMI 25 -29 Overweight Nutritional Risks: None Diabetes: No  How often do you need to have someone help you when you read instructions, pamphlets, or other written materials from your doctor or pharmacy?: 1 - Never  Diabetic? No  Interpreter Needed?: No  Information entered by :: Kyndle Schlender, LPN   Activities of Daily Living In your present state of health, do you have any difficulty performing the following activities: 02/21/2021  Hearing? Y  Comment mild - declines hearing aids  Vision? N  Difficulty concentrating or making decisions? N  Walking or climbing stairs? Y  Comment scoliosis - can walk up a ramp, not many stairs  Dressing or bathing? N  Doing errands, shopping? N  Preparing Food and eating ? N  Using the Toilet? N  In the past six months, have you accidently leaked urine? Y  Comment usually at night or when she first wakes up - mild  Do you have problems with loss of bowel control? N  Managing your Medications? N  Managing your Finances? N  Housekeeping or managing your Housekeeping? N  Some recent data might be hidden    Patient Care Team: Claretta Fraise, MD as PCP - General (Family Medicine)  Indicate any recent Medical Services you may have  received from other than [facility] providers in the past year (date may be approximate).     Assessment:   This is a routine wellness examination for Savannah Dean.  Hearing/Vision screen Hearing Screening - Comments:: C/o mild hearing difficulties - declines hearing aids Vision Screening - Comments:: Wears glasses prn - behind on annual eye exams - no eye doctor at this time  Dietary issues and exercise activities discussed: Current Exercise Habits: Home exercise routine, Type of exercise: walking, Time (Minutes): 20, Frequency (Times/Week): 7, Weekly Exercise (Minutes/Week): 140, Intensity: Mild, Exercise limited by: respiratory conditions(s);orthopedic condition(s)   Goals Addressed             This Visit's Progress    Prevent falls         Depression Screen PHQ 2/9 Scores 02/21/2021 10/02/2020 10/02/2020 04/04/2020 06/29/2019 06/14/2018 11/16/2017  PHQ - 2 Score 0 0 0 0 0 0 0  PHQ- 9 Score - 0 - - 0 - -    Fall Risk Fall Risk  02/21/2021 10/02/2020 04/04/2020 06/29/2019 06/14/2018  Falls in the past year? 0 0 0 1 0  Number falls in past yr: 0 - 0 1 -  Injury with Fall? 0 - 1 0 -  Comment - - fractured femur - -  Risk for fall due to : Medication side effect;History of fall(s);Impaired balance/gait;Orthopedic patient - History of fall(s);Impaired mobility Impaired balance/gait -  Follow up Education provided;Falls prevention discussed - Falls evaluation completed Falls prevention discussed -    FALL RISK PREVENTION PERTAINING TO THE HOME:  Any stairs in or around the home? No  If so, are there any without handrails? No  Home free of loose throw rugs in walkways, pet beds, electrical cords, etc? Yes  Adequate lighting in your home to reduce risk of falls? Yes   ASSISTIVE DEVICES UTILIZED TO PREVENT FALLS:  Life alert? No  Use of a cane, walker or w/c? Yes  Grab bars in the bathroom? Yes  Shower chair or bench in shower? No  Elevated toilet seat or a handicapped toilet? Yes   TIMED UP AND  GO:  Was the test performed? No . Telephonic visit   Cognitive Function:     6CIT Screen 02/21/2021  What Year? 0 points  What month? 0  points  What time? 0 points  Count back from 20 0 points  Months in reverse 0 points  Repeat phrase 4 points  Total Score 4    Immunizations Immunization History  Administered Date(s) Administered   Moderna Sars-Covid-2 Vaccination 09/11/2019, 10/09/2019, 05/07/2020    TDAP status: Due, Education has been provided regarding the importance of this vaccine. Advised may receive this vaccine at local pharmacy or Health Dept. Aware to provide a copy of the vaccination record if obtained from local pharmacy or Health Dept. Verbalized acceptance and understanding.  Flu Vaccine status: Declined, Education has been provided regarding the importance of this vaccine but patient still declined. Advised may receive this vaccine at local pharmacy or Health Dept. Aware to provide a copy of the vaccination record if obtained from local pharmacy or Health Dept. Verbalized acceptance and understanding.  Pneumococcal vaccine status: Declined,  Education has been provided regarding the importance of this vaccine but patient still declined. Advised may receive this vaccine at local pharmacy or Health Dept. Aware to provide a copy of the vaccination record if obtained from local pharmacy or Health Dept. Verbalized acceptance and understanding.   Covid-19 vaccine status: Completed vaccines  Qualifies for Shingles Vaccine? Yes   Zostavax completed No   Shingrix Completed?: No.    Education has been provided regarding the importance of this vaccine. Patient has been advised to call insurance company to determine out of pocket expense if they have not yet received this vaccine. Advised may also receive vaccine at local pharmacy or Health Dept. Verbalized acceptance and understanding.  Screening Tests Health Maintenance  Topic Date Due   Hepatitis C Screening  Never done    Zoster Vaccines- Shingrix (1 of 2) Never done   DEXA SCAN  Never done   COVID-19 Vaccine (4 - Booster for Moderna series) 09/05/2020   INFLUENZA VACCINE  Never done   TETANUS/TDAP  10/02/2021 (Originally 01/14/1965)   COLONOSCOPY (Pts 45-41yrs Insurance coverage will need to be confirmed)  10/04/2024   HPV VACCINES  Aged Out    Health Maintenance  Health Maintenance Due  Topic Date Due   Hepatitis C Screening  Never done   Zoster Vaccines- Shingrix (1 of 2) Never done   DEXA SCAN  Never done   COVID-19 Vaccine (4 - Booster for Moderna series) 09/05/2020   INFLUENZA VACCINE  Never done    Colorectal cancer screening: Type of screening: Colonoscopy. Completed 5/6/221. Repeat every 5 years  Mammogram status: No longer required due to age and patient declines.  Bone Density declined - takes Fosamax - dx with Osteoporosis  Lung Cancer Screening: (Low Dose CT Chest recommended if Age 14-80 years, 30 pack-year currently smoking OR have quit w/in 15years.) does not qualify.   Additional Screening:  Hepatitis C Screening: does qualify; due  Vision Screening: Recommended annual ophthalmology exams for early detection of glaucoma and other disorders of the eye. Is the patient up to date with their annual eye exam?  No  Who is the provider or what is the name of the office in which the patient attends annual eye exams? none If pt is not established with a provider, would they like to be referred to a provider to establish care? No .   Dental Screening: Recommended annual dental exams for proper oral hygiene  Community Resource Referral / Chronic Care Management: CRR required this visit?  No   CCM required this visit?  No      Plan:  I have personally reviewed and noted the following in the patient's chart:   Medical and social history Use of alcohol, tobacco or illicit drugs  Current medications and supplements including opioid prescriptions.  Functional ability and  status Nutritional status Physical activity Advanced directives List of other physicians Hospitalizations, surgeries, and ER visits in previous 12 months Vitals Screenings to include cognitive, depression, and falls Referrals and appointments  In addition, I have reviewed and discussed with patient certain preventive protocols, quality metrics, and best practice recommendations. A written personalized care plan for preventive services as well as general preventive health recommendations were provided to patient.     Sandrea Hammond, LPN   [id number]   Nurse Notes: Declines further vaccines and screenings

## 2021-02-21 NOTE — Patient Instructions (Signed)
Savannah Dean , Thank you for taking time to come for your Medicare Wellness Visit. I appreciate your ongoing commitment to your health goals. Please review the following plan we discussed and let me know if I can assist you in the future.   Screening recommendations/referrals: Colonoscopy: Done 10/05/2019 - Repeat in 5 years Mammogram: Declined Bone Density: Declined Recommended yearly ophthalmology/optometry visit for glaucoma screening and checkup Recommended yearly dental visit for hygiene and checkup  Vaccinations: Influenza vaccine: Declined Pneumococcal vaccine: Declined Tdap vaccine: Declined Shingles vaccine: Declined   Covid-19:Done 09/11/19, 10/09/19, & 05/07/2020  Advanced directives: Advance directive discussed with you today. Even though you declined this today, please call our office should you change your mind, and we can give you the proper paperwork for you to fill out.   Conditions/risks identified: Aim for 30 minutes of exercise or brisk walking each day, drink 6-8 glasses of water and eat lots of fruits and vegetables.   Next appointment: Follow up in one year for your annual wellness visit    Preventive Care 65 Years and Older, Female Preventive care refers to lifestyle choices and visits with your health care provider that can promote health and wellness. What does preventive care include? A yearly physical exam. This is also called an annual well check. Dental exams once or twice a year. Routine eye exams. Ask your health care provider how often you should have your eyes checked. Personal lifestyle choices, including: Daily care of your teeth and gums. Regular physical activity. Eating a healthy diet. Avoiding tobacco and drug use. Limiting alcohol use. Practicing safe sex. Taking low-dose aspirin every day. Taking vitamin and mineral supplements as recommended by your health care provider. What happens during an annual well check? The services and screenings  done by your health care provider during your annual well check will depend on your age, overall health, lifestyle risk factors, and family history of disease. Counseling  Your health care provider may ask you questions about your: Alcohol use. Tobacco use. Drug use. Emotional well-being. Home and relationship well-being. Sexual activity. Eating habits. History of falls. Memory and ability to understand (cognition). Work and work Statistician. Reproductive health. Screening  You may have the following tests or measurements: Height, weight, and BMI. Blood pressure. Lipid and cholesterol levels. These may be checked every 5 years, or more frequently if you are over 21 years old. Skin check. Lung cancer screening. You may have this screening every year starting at age 77 if you have a 30-pack-year history of smoking and currently smoke or have quit within the past 15 years. Fecal occult blood test (FOBT) of the stool. You may have this test every year starting at age 77. Flexible sigmoidoscopy or colonoscopy. You may have a sigmoidoscopy every 5 years or a colonoscopy every 10 years starting at age 77. Hepatitis C blood test. Hepatitis B blood test. Sexually transmitted disease (STD) testing. Diabetes screening. This is done by checking your blood sugar (glucose) after you have not eaten for a while (fasting). You may have this done every 1-3 years. Bone density scan. This is done to screen for osteoporosis. You may have this done starting at age 77. Mammogram. This may be done every 1-2 years. Talk to your health care provider about how often you should have regular mammograms. Talk with your health care provider about your test results, treatment options, and if necessary, the need for more tests. Vaccines  Your health care provider may recommend certain vaccines, such as: Influenza  vaccine. This is recommended every year. Tetanus, diphtheria, and acellular pertussis (Tdap, Td)  vaccine. You may need a Td booster every 10 years. Zoster vaccine. You may need this after age 77. Pneumococcal 13-valent conjugate (PCV13) vaccine. One dose is recommended after age 77. Pneumococcal polysaccharide (PPSV23) vaccine. One dose is recommended after age 77. Talk to your health care provider about which screenings and vaccines you need and how often you need them. This information is not intended to replace advice given to you by your health care provider. Make sure you discuss any questions you have with your health care provider. Document Released: 06/14/2015 Document Revised: 02/05/2016 Document Reviewed: 03/19/2015 Elsevier Interactive Patient Education  2017 [employer] Prevention in the Home Falls can cause injuries. They can happen to people of all ages. There are many things you can do to make your home safe and to help prevent falls. What can I do on the outside of my home? Regularly fix the edges of walkways and driveways and fix any cracks. Remove anything that might make you trip as you walk through a door, such as a raised step or threshold. Trim any bushes or trees on the path to your home. Use bright outdoor lighting. Clear any walking paths of anything that might make someone trip, such as rocks or tools. Regularly check to see if handrails are loose or broken. Make sure that both sides of any steps have handrails. Any raised decks and porches should have guardrails on the edges. Have any leaves, snow, or ice cleared regularly. Use sand or salt on walking paths during winter. Clean up any spills in your garage right away. This includes oil or grease spills. What can I do in the bathroom? Use night lights. Install grab bars by the toilet and in the tub and shower. Do not use towel bars as grab bars. Use non-skid mats or decals in the tub or shower. If you need to sit down in the shower, use a plastic, non-slip stool. Keep the floor dry. Clean up any  water that spills on the floor as soon as it happens. Remove soap buildup in the tub or shower regularly. Attach bath mats securely with double-sided non-slip rug tape. Do not have throw rugs and other things on the floor that can make you trip. What can I do in the bedroom? Use night lights. Make sure that you have a light by your bed that is easy to reach. Do not use any sheets or blankets that are too big for your bed. They should not hang down onto the floor. Have a firm chair that has side arms. You can use this for support while you get dressed. Do not have throw rugs and other things on the floor that can make you trip. What can I do in the kitchen? Clean up any spills right away. Avoid walking on wet floors. Keep items that you use a lot in easy-to-reach places. If you need to reach something above you, use a strong step stool that has a grab bar. Keep electrical cords out of the way. Do not use floor polish or wax that makes floors slippery. If you must use wax, use non-skid floor wax. Do not have throw rugs and other things on the floor that can make you trip. What can I do with my stairs? Do not leave any items on the stairs. Make sure that there are handrails on both sides of the stairs and use them. Fix  handrails that are broken or loose. Make sure that handrails are as long as the stairways. Check any carpeting to make sure that it is firmly attached to the stairs. Fix any carpet that is loose or worn. Avoid having throw rugs at the top or bottom of the stairs. If you do have throw rugs, attach them to the floor with carpet tape. Make sure that you have a light switch at the top of the stairs and the bottom of the stairs. If you do not have them, ask someone to add them for you. What else can I do to help prevent falls? Wear shoes that: Do not have high heels. Have rubber bottoms. Are comfortable and fit you well. Are closed at the toe. Do not wear sandals. If you use a  stepladder: Make sure that it is fully opened. Do not climb a closed stepladder. Make sure that both sides of the stepladder are locked into place. Ask someone to hold it for you, if possible. Clearly mark and make sure that you can see: Any grab bars or handrails. First and last steps. Where the edge of each step is. Use tools that help you move around (mobility aids) if they are needed. These include: Canes. Walkers. Scooters. Crutches. Turn on the lights when you go into a dark area. Replace any light bulbs as soon as they burn out. Set up your furniture so you have a clear path. Avoid moving your furniture around. If any of your floors are uneven, fix them. If there are any pets around you, be aware of where they are. Review your medicines with your doctor. Some medicines can make you feel dizzy. This can increase your chance of falling. Ask your doctor what other things that you can do to help prevent falls. This information is not intended to replace advice given to you by your health care provider. Make sure you discuss any questions you have with your health care provider. Document Released: 03/14/2009 Document Revised: 10/24/2015 Document Reviewed: 06/22/2014 Elsevier Interactive Patient Education  2017 [employer].

## 2021-03-25 ENCOUNTER — Other Ambulatory Visit: Payer: Self-pay | Admitting: Family Medicine

## 2021-03-25 DIAGNOSIS — M41115 Juvenile idiopathic scoliosis, thoracolumbar region: Secondary | ICD-10-CM

## 2021-03-27 ENCOUNTER — Other Ambulatory Visit: Payer: Self-pay | Admitting: Family Medicine

## 2021-04-08 ENCOUNTER — Other Ambulatory Visit: Payer: Self-pay

## 2021-04-08 ENCOUNTER — Encounter: Payer: Self-pay | Admitting: Family Medicine

## 2021-04-08 ENCOUNTER — Ambulatory Visit (INDEPENDENT_AMBULATORY_CARE_PROVIDER_SITE_OTHER): Payer: Medicare Other | Admitting: Family Medicine

## 2021-04-08 VITALS — BP 152/83 | HR 80 | Temp 98.4°F | Ht 62.0 in | Wt 149.6 lb

## 2021-04-08 DIAGNOSIS — F339 Major depressive disorder, recurrent, unspecified: Secondary | ICD-10-CM | POA: Diagnosis not present

## 2021-04-08 DIAGNOSIS — E785 Hyperlipidemia, unspecified: Secondary | ICD-10-CM

## 2021-04-08 DIAGNOSIS — R200 Anesthesia of skin: Secondary | ICD-10-CM | POA: Diagnosis not present

## 2021-04-08 DIAGNOSIS — I10 Essential (primary) hypertension: Secondary | ICD-10-CM | POA: Diagnosis not present

## 2021-04-08 DIAGNOSIS — K219 Gastro-esophageal reflux disease without esophagitis: Secondary | ICD-10-CM

## 2021-04-08 MED ORDER — VALSARTAN 160 MG PO TABS: ORAL_TABLET | ORAL | 2 refills | Status: DC

## 2021-04-08 MED ORDER — CLARITIN-D 12 HOUR 5-120 MG PO TB12: 1.0000 | ORAL_TABLET | Freq: Two times a day (BID) | ORAL | 3 refills | Status: DC

## 2021-04-08 MED ORDER — CITALOPRAM HYDROBROMIDE 40 MG PO TABS: 60.0000 mg | ORAL_TABLET | Freq: Every day | ORAL | 3 refills | Status: DC

## 2021-04-08 MED ORDER — ALENDRONATE SODIUM 70 MG PO TABS: 70.0000 mg | ORAL_TABLET | ORAL | 3 refills | Status: DC

## 2021-04-08 MED ORDER — GABAPENTIN 100 MG PO CAPS: 100.0000 mg | ORAL_CAPSULE | Freq: Two times a day (BID) | ORAL | 3 refills | Status: DC | PRN

## 2021-04-08 MED ORDER — OMEPRAZOLE 20 MG PO CPDR: DELAYED_RELEASE_CAPSULE | ORAL | 2 refills | Status: DC

## 2021-04-08 NOTE — Progress Notes (Signed)
Subjective:  Patient ID: Savannah Dean, female    DOB: 11/12/1945  Age: 75 y.o. MRN: [id number]  CC: Medical Management of Chronic Issues   HPI Savannah Dean presents for  follow-up of hypertension. Patient has no history of headache chest pain or shortness of breath or recent cough. Patient also denies symptoms of TIA such as focal numbness or weakness. Patient denies side effects from medication. States taking it regularly.   History Savannah Dean has a past medical history of Arthritis, Depression, HOH (hard of hearing), and Hypertension.   She has a past surgical history that includes Spinal fusion (1950); No past surgeries; and Intramedullary (im) nail intertrochanteric (Left, 12/10/2019).   Her family history includes Diabetes in her brother and maternal grandfather; Hypertension in her father and mother.She reports that she has never smoked. She has never used smokeless tobacco. She reports that she does not drink alcohol and does not use drugs.  Current Outpatient Medications on File Prior to Visit  Medication Sig Dispense Refill   acetaminophen (TYLENOL) 325 MG tablet Take 1-2 tablets (325-650 mg total) by mouth every 6 (six) hours as needed for mild pain (pain score 1-3 or temp > 100.5).     aspirin 325 MG EC tablet Take 1 tablet (325 mg total) by mouth daily with breakfast. 30 tablet 0   chlorhexidine (PERIDEX) 0.12 % solution SMARTSIG:By Mouth     diclofenac sodium (VOLTAREN) 1 % GEL Apply 4 g topically 4 (four) times daily. 400 g 2   docusate sodium (COLACE) 100 MG capsule Take 1 capsule (100 mg total) by mouth 2 (two) times daily. 10 capsule 0   EPINEPHrine 0.3 mg/0.3 mL IJ SOAJ injection Inject 0.3 mg into the muscle as needed for anaphylaxis.      fluticasone (FLONASE) 50 MCG/ACT nasal spray Place 1 spray into both nostrils daily as needed for allergies. 16 g 11   ketoconazole (NIZORAL) 2 % cream Apply topically 2 (two) times daily. 60 g 0   levocetirizine (XYZAL) 5 MG  tablet Take 1 tablet (5 mg total) by mouth every evening. 90 tablet 3   meloxicam (MOBIC) 15 MG tablet TAKE ONE (1) TABLET EACH DAY 30 tablet 0   methocarbamol (ROBAXIN) 500 MG tablet Take 1 tablet (500 mg total) by mouth every 6 (six) hours as needed. 40 tablet 1   metroNIDAZOLE (METROCREAM) 0.75 % cream Apply topically 2 (two) times daily. 45 g 5   Multiple Vitamin (MULTIVITAMIN WITH MINERALS) TABS tablet Take 1 tablet by mouth daily.     neomycin-polymyxin b-dexamethasone (MAXITROL) 3.5-10000-0.1 OINT APPLY THREE TIMES A DAY AS DIRECTED.  Needs to be seen before next refill 3.5 g 0   polyethylene glycol (MIRALAX / GLYCOLAX) 17 g packet Take 17 g by mouth daily. 14 each 0   PROAIR RESPICLICK 741 (90 Base) MCG/ACT AEPB Inhale 2 puffs into the lungs every 6 (six) hours as needed (for wheezing).      senna-docusate (SENOKOT-S) 8.6-50 MG tablet Take 1 tablet by mouth at bedtime.     tiZANidine (ZANAFLEX) 4 MG tablet TAKE 1 TABLET EVERY 8 HOURS AS NEEDED FOR MUSCLE SPASMS 90 tablet 05   No current facility-administered medications on file prior to visit.    ROS Review of Systems  Constitutional: Negative.   HENT: Negative.    Eyes:  Negative for visual disturbance.  Respiratory:  Negative for shortness of breath.   Cardiovascular:  Negative for chest pain.  Gastrointestinal:  Negative for abdominal pain.  Musculoskeletal:  Negative for arthralgias.   Objective:  BP (!) 152/83   Pulse 80   Temp 98.4 F (36.9 C)   Ht '5\' 2"'  (1.575 m)   Wt 149 lb 9.6 oz (67.9 kg)   SpO2 93%   BMI 27.36 kg/m   BP Readings from Last 3 Encounters:  04/08/21 (!) 152/83  10/02/20 (!) 144/71  04/04/20 (!) 159/81    Wt Readings from Last 3 Encounters:  04/08/21 149 lb 9.6 oz (67.9 kg)  02/21/21 152 lb (68.9 kg)  10/02/20 152 lb 3.2 oz (69 kg)     Physical Exam Constitutional:      General: She is not in acute distress.    Appearance: She is well-developed.  Cardiovascular:     Rate and Rhythm:  Normal rate and regular rhythm.  Pulmonary:     Breath sounds: Normal breath sounds.  Musculoskeletal:        General: Normal range of motion.  Skin:    General: Skin is warm and dry.  Neurological:     Mental Status: She is alert and oriented to person, place, and time.      Assessment & Plan:   Savannah Dean was seen today for medical management of chronic issues.  Diagnoses and all orders for this visit:  Hyperlipidemia, unspecified hyperlipidemia type -     Lipid panel  Essential hypertension -     CBC with Differential/Platelet -     CMP14+EGFR  Depression, recurrent (HCC) -     citalopram (CELEXA) 40 MG tablet; Take 1.5 tablets (60 mg total) by mouth daily.  Numbness in feet -     gabapentin (NEURONTIN) 100 MG capsule; Take 1 capsule (100 mg total) by mouth 2 (two) times daily as needed.  Gastroesophageal reflux disease without esophagitis -     omeprazole (PRILOSEC) 20 MG capsule; TAKE ONE (1) CAPSULE EACH DAY  Other orders -     alendronate (FOSAMAX) 70 MG tablet; Take 1 tablet (70 mg total) by mouth every Saturday. -     valsartan (DIOVAN) 160 MG tablet; TAKE ONE (1) TABLET BY MOUTH EVERY DAY -     loratadine-pseudoephedrine (CLARITIN-D 12 HOUR) 5-120 MG tablet; Take 1 tablet by mouth 2 (two) times daily.  Allergies as of 04/08/2021       Reactions   Molds & Smuts         Medication List        Accurate as of April 08, 2021  1:34 PM. If you have any questions, ask your nurse or doctor.          acetaminophen 325 MG tablet Commonly known as: TYLENOL Take 1-2 tablets (325-650 mg total) by mouth every 6 (six) hours as needed for mild pain (pain score 1-3 or temp > 100.5).   alendronate 70 MG tablet Commonly known as: FOSAMAX Take 1 tablet (70 mg total) by mouth every Saturday. Start taking on: April 12, 2021   aspirin 325 MG EC tablet Take 1 tablet (325 mg total) by mouth daily with breakfast.   chlorhexidine 0.12 % solution Commonly known as:  PERIDEX SMARTSIG:By Mouth   citalopram 40 MG tablet Commonly known as: CELEXA Take 1.5 tablets (60 mg total) by mouth daily.   Claritin-D 12 Hour 5-120 MG tablet Generic drug: loratadine-pseudoephedrine Take 1 tablet by mouth 2 (two) times daily. Started by: Claretta Fraise, MD   diclofenac sodium 1 % Gel Commonly known as: VOLTAREN Apply 4 g topically 4 (four) times daily.  docusate sodium 100 MG capsule Commonly known as: COLACE Take 1 capsule (100 mg total) by mouth 2 (two) times daily.   EPINEPHrine 0.3 mg/0.3 mL Soaj injection Commonly known as: EPI-PEN Inject 0.3 mg into the muscle as needed for anaphylaxis.   fluticasone 50 MCG/ACT nasal spray Commonly known as: FLONASE Place 1 spray into both nostrils daily as needed for allergies.   gabapentin 100 MG capsule Commonly known as: NEURONTIN Take 1 capsule (100 mg total) by mouth 2 (two) times daily as needed.   ketoconazole 2 % cream Commonly known as: NIZORAL Apply topically 2 (two) times daily.   levocetirizine 5 MG tablet Commonly known as: XYZAL Take 1 tablet (5 mg total) by mouth every evening.   meloxicam 15 MG tablet Commonly known as: MOBIC TAKE ONE (1) TABLET EACH DAY   methocarbamol 500 MG tablet Commonly known as: ROBAXIN Take 1 tablet (500 mg total) by mouth every 6 (six) hours as needed.   metroNIDAZOLE 0.75 % cream Commonly known as: METROCREAM Apply topically 2 (two) times daily.   multivitamin with minerals Tabs tablet Take 1 tablet by mouth daily.   neomycin-polymyxin b-dexamethasone 3.5-10000-0.1 Oint Commonly known as: MAXITROL APPLY THREE TIMES A DAY AS DIRECTED.  Needs to be seen before next refill   omeprazole 20 MG capsule Commonly known as: PRILOSEC TAKE ONE (1) CAPSULE EACH DAY   polyethylene glycol 17 g packet Commonly known as: MIRALAX / GLYCOLAX Take 17 g by mouth daily.   ProAir RespiClick 623 (90 Base) MCG/ACT Aepb Generic drug: Albuterol Sulfate Inhale 2 puffs  into the lungs every 6 (six) hours as needed (for wheezing).   senna-docusate 8.6-50 MG tablet Commonly known as: Senokot-S Take 1 tablet by mouth at bedtime.   tiZANidine 4 MG tablet Commonly known as: ZANAFLEX TAKE 1 TABLET EVERY 8 HOURS AS NEEDED FOR MUSCLE SPASMS   valsartan 160 MG tablet Commonly known as: DIOVAN TAKE ONE (1) TABLET BY MOUTH EVERY DAY What changed:  how to take this when to take this additional instructions Changed by: Claretta Fraise, MD        Meds ordered this encounter  Medications   alendronate (FOSAMAX) 70 MG tablet    Sig: Take 1 tablet (70 mg total) by mouth every Saturday.    Dispense:  12 tablet    Refill:  3   citalopram (CELEXA) 40 MG tablet    Sig: Take 1.5 tablets (60 mg total) by mouth daily.    Dispense:  135 tablet    Refill:  3   gabapentin (NEURONTIN) 100 MG capsule    Sig: Take 1 capsule (100 mg total) by mouth 2 (two) times daily as needed.    Dispense:  180 capsule    Refill:  3   valsartan (DIOVAN) 160 MG tablet    Sig: TAKE ONE (1) TABLET BY MOUTH EVERY DAY    Dispense:  90 tablet    Refill:  2   omeprazole (PRILOSEC) 20 MG capsule    Sig: TAKE ONE (1) CAPSULE EACH DAY    Dispense:  90 capsule    Refill:  2   loratadine-pseudoephedrine (CLARITIN-D 12 HOUR) 5-120 MG tablet    Sig: Take 1 tablet by mouth 2 (two) times daily.    Dispense:  180 tablet    Refill:  3     Follow-up: Return in about 6 months (around 10/06/2021).  Claretta Fraise, M.D.

## 2021-04-09 LAB — CMP14+EGFR
ALT: 13 IU/L (ref 0–32)
AST: 22 IU/L (ref 0–40)
Albumin/Globulin Ratio: 2.2 (ref 1.2–2.2)
Albumin: 4.6 g/dL (ref 3.7–4.7)
Alkaline Phosphatase: 84 IU/L (ref 44–121)
BUN/Creatinine Ratio: 14 (ref 12–28)
BUN: 12 mg/dL (ref 8–27)
Bilirubin Total: 0.3 mg/dL (ref 0.0–1.2)
CO2: 26 mmol/L (ref 20–29)
Calcium: 10.1 mg/dL (ref 8.7–10.3)
Chloride: 100 mmol/L (ref 96–106)
Creatinine, Ser: 0.87 mg/dL (ref 0.57–1.00)
Globulin, Total: 2.1 g/dL (ref 1.5–4.5)
Glucose: 83 mg/dL (ref 70–99)
Potassium: 4.6 mmol/L (ref 3.5–5.2)
Sodium: 138 mmol/L (ref 134–144)
Total Protein: 6.7 g/dL (ref 6.0–8.5)
eGFR: 69 mL/min/{1.73_m2} (ref >59)

## 2021-04-09 LAB — CBC WITH DIFFERENTIAL/PLATELET
Basophils Absolute: 0.1 10*3/uL (ref 0.0–0.2)
Basos: 1 %
EOS (ABSOLUTE): 0.1 10*3/uL (ref 0.0–0.4)
Eos: 2 %
Hematocrit: 36.8 % (ref 34.0–46.6)
Hemoglobin: 12.4 g/dL (ref 11.1–15.9)
Immature Grans (Abs): 0 10*3/uL (ref 0.0–0.1)
Immature Granulocytes: 0 %
Lymphocytes Absolute: 1.4 10*3/uL (ref 0.7–3.1)
Lymphs: 23 %
MCH: 29.9 pg (ref 26.6–33.0)
MCHC: 33.7 g/dL (ref 31.5–35.7)
MCV: 89 fL (ref 79–97)
Monocytes Absolute: 0.5 10*3/uL (ref 0.1–0.9)
Monocytes: 8 %
Neutrophils Absolute: 4 10*3/uL (ref 1.4–7.0)
Neutrophils: 66 %
Platelets: 209 10*3/uL (ref 150–450)
RBC: 4.15 x10E6/uL (ref 3.77–5.28)
RDW: 12.7 % (ref 11.7–15.4)
WBC: 6.2 10*3/uL (ref 3.4–10.8)

## 2021-04-09 LAB — LIPID PANEL
Chol/HDL Ratio: 3.3 ratio (ref 0.0–4.4)
Cholesterol, Total: 161 mg/dL (ref 100–199)
HDL: 49 mg/dL (ref >39)
LDL Chol Calc (NIH): 89 mg/dL (ref 0–99)
Triglycerides: 131 mg/dL (ref 0–149)
VLDL Cholesterol Cal: 23 mg/dL (ref 5–40)

## 2021-04-11 NOTE — Progress Notes (Signed)
Hello Copelyn,  Your lab result is normal and/or stable.Some minor variations that are not significant are commonly marked abnormal, but do not represent any medical problem for you.  Best regards, Elster Corbello, M.D.

## 2021-04-21 ENCOUNTER — Other Ambulatory Visit: Payer: Self-pay | Admitting: Family Medicine

## 2021-04-21 DIAGNOSIS — L719 Rosacea, unspecified: Secondary | ICD-10-CM

## 2021-04-22 NOTE — Telephone Encounter (Signed)
Ok to fill or ntbs?

## 2021-05-13 ENCOUNTER — Other Ambulatory Visit: Payer: Self-pay | Admitting: Family Medicine

## 2021-05-13 DIAGNOSIS — M41115 Juvenile idiopathic scoliosis, thoracolumbar region: Secondary | ICD-10-CM

## 2021-06-18 ENCOUNTER — Other Ambulatory Visit: Payer: Self-pay | Admitting: Family Medicine

## 2021-06-18 DIAGNOSIS — M41115 Juvenile idiopathic scoliosis, thoracolumbar region: Secondary | ICD-10-CM

## 2021-06-20 ENCOUNTER — Telehealth: Payer: Self-pay | Admitting: *Deleted

## 2021-06-20 DIAGNOSIS — F339 Major depressive disorder, recurrent, unspecified: Secondary | ICD-10-CM

## 2021-06-20 NOTE — Telephone Encounter (Signed)
Citalopram 40 mg tab - qty limit PA started  ([facility] gave a 1 mo supply to pt )  Key: [id number] Sent to plan

## 2021-06-20 NOTE — Telephone Encounter (Signed)
Approved today Effective from 06/20/2021 through 06/20/2022 [facility] aware

## 2021-08-11 ENCOUNTER — Other Ambulatory Visit: Payer: Self-pay | Admitting: Family Medicine

## 2021-09-09 ENCOUNTER — Ambulatory Visit (INDEPENDENT_AMBULATORY_CARE_PROVIDER_SITE_OTHER): Payer: Medicare Other | Admitting: Family Medicine

## 2021-09-09 ENCOUNTER — Encounter: Payer: Self-pay | Admitting: Family Medicine

## 2021-09-09 VITALS — BP 138/69 | HR 80 | Temp 97.7°F | Wt 153.2 lb

## 2021-09-09 DIAGNOSIS — M2170 Unequal limb length (acquired), unspecified site: Secondary | ICD-10-CM | POA: Diagnosis not present

## 2021-09-09 DIAGNOSIS — M41115 Juvenile idiopathic scoliosis, thoracolumbar region: Secondary | ICD-10-CM | POA: Diagnosis not present

## 2021-09-09 NOTE — Progress Notes (Signed)
? ?Subjective:  ?Patient ID: Savannah Dean, female    DOB: 11-08-1945  Age: 76 y.o. MRN: [id number] ? ?CC: Referral (ortho) ? ? ?HPI ?Savannah Dean presents for need for back brace due to lifelong scoliosis. Her left side is weak. Sometimes painful. She took a year to recuperate from a left femur fracture. She has used a back brace in the past. She would like to try a new one to help strengthen and support her back. ? ? ? ?  09/09/2021  ? 11:56 AM 04/08/2021  ?  1:05 PM 02/21/2021  ?  9:07 AM  ?Depression screen PHQ 2/9  ?Decreased Interest 0 0 0  ?Down, Depressed, Hopeless 0 0 0  ?PHQ - 2 Score 0 0 0  ? ? ?History ?Savannah Dean has a past medical history of Arthritis, Depression, HOH (hard of hearing), and Hypertension.  ? ?She has a past surgical history that includes Spinal fusion (1950); No past surgeries; and Intramedullary (im) nail intertrochanteric (Left, 12/10/2019).  ? ?Her family history includes Diabetes in her brother and maternal grandfather; Hypertension in her father and mother.She reports that she has never smoked. She has never used smokeless tobacco. She reports that she does not drink alcohol and does not use drugs. ? ? ? ?ROS ?Review of Systems ? ?Objective:  ?BP 138/69   Pulse 80   Temp 97.7 ?F (36.5 ?C)   Wt 153 lb 3.2 oz (69.5 kg)   SpO2 92%   BMI 28.02 kg/m?  ? ?BP Readings from Last 3 Encounters:  ?09/09/21 138/69  ?04/08/21 (!) 152/83  ?10/02/20 (!) 144/71  ? ? ?Wt Readings from Last 3 Encounters:  ?09/09/21 153 lb 3.2 oz (69.5 kg)  ?04/08/21 149 lb 9.6 oz (67.9 kg)  ?02/21/21 152 lb (68.9 kg)  ? ? ? ?Physical Exam ?Constitutional:   ?   General: She is not in acute distress. ?   Appearance: She is well-developed.  ?Cardiovascular:  ?   Rate and Rhythm: Normal rate and regular rhythm.  ?Pulmonary:  ?   Breath sounds: Normal breath sounds.  ?Musculoskeletal:     ?   General: Deformity (marked scoliosis) present. Normal range of motion.  ?Skin: ?   General: Skin is warm and dry.   ?Neurological:  ?   Mental Status: She is alert and oriented to person, place, and time.  ? ? ? ? ?Assessment & Plan:  ? ?Savannah Dean was seen today for referral. ? ?Diagnoses and all orders for this visit: ? ?Juvenile idiopathic scoliosis of thoracolumbar region ?-     Ambulatory referral to Orthopedics ? ?Leg length discrepancy ?-     Ambulatory referral to Orthopedics ? ? ? ? ? ? ?I am having Stacie Acres maintain her diclofenac sodium, EPINEPHrine, ProAir RespiClick, aspirin, multivitamin with minerals, docusate sodium, polyethylene glycol, senna-docusate, acetaminophen, levocetirizine, fluticasone, methocarbamol, tiZANidine, chlorhexidine, alendronate, citalopram, gabapentin, valsartan, omeprazole, Claritin-D 12 Hour, metroNIDAZOLE, ketoconazole, meloxicam, and neomycin-polymyxin b-dexamethasone. ? ?Allergies as of 09/09/2021   ? ?   Reactions  ? Molds & Smuts   ? ?  ? ?  ?Medication List  ?  ? ?  ? Accurate as of September 09, 2021  2:14 PM. If you have any questions, ask your nurse or doctor.  ?  ?  ? ?  ? ?acetaminophen 325 MG tablet ?Commonly known as: TYLENOL ?Take 1-2 tablets (325-650 mg total) by mouth every 6 (six) hours as needed for mild pain (pain score 1-3 or temp >  100.5). ?  ?alendronate 70 MG tablet ?Commonly known as: FOSAMAX ?Take 1 tablet (70 mg total) by mouth every Saturday. ?  ?aspirin 325 MG EC tablet ?Take 1 tablet (325 mg total) by mouth daily with breakfast. ?  ?chlorhexidine 0.12 % solution ?Commonly known as: PERIDEX ?SMARTSIG:By Mouth ?  ?citalopram 40 MG tablet ?Commonly known as: CELEXA ?Take 1.5 tablets (60 mg total) by mouth daily. ?  ?Claritin-D 12 Hour 5-120 MG tablet ?Generic drug: loratadine-pseudoephedrine ?Take 1 tablet by mouth 2 (two) times daily. ?  ?diclofenac sodium 1 % Gel ?Commonly known as: VOLTAREN ?Apply 4 g topically 4 (four) times daily. ?  ?docusate sodium 100 MG capsule ?Commonly known as: COLACE ?Take 1 capsule (100 mg total) by mouth 2 (two) times daily. ?   ?EPINEPHrine 0.3 mg/0.3 mL Soaj injection ?Commonly known as: EPI-PEN ?Inject 0.3 mg into the muscle as needed for anaphylaxis. ?  ?fluticasone 50 MCG/ACT nasal spray ?Commonly known as: FLONASE ?Place 1 spray into both nostrils daily as needed for allergies. ?  ?gabapentin 100 MG capsule ?Commonly known as: NEURONTIN ?Take 1 capsule (100 mg total) by mouth 2 (two) times daily as needed. ?  ?ketoconazole 2 % cream ?Commonly known as: NIZORAL ?APPLY TWICE DAILY ?  ?levocetirizine 5 MG tablet ?Commonly known as: XYZAL ?Take 1 tablet (5 mg total) by mouth every evening. ?  ?meloxicam 15 MG tablet ?Commonly known as: MOBIC ?TAKE ONE (1) TABLET EACH DAY ?  ?methocarbamol 500 MG tablet ?Commonly known as: ROBAXIN ?Take 1 tablet (500 mg total) by mouth every 6 (six) hours as needed. ?  ?metroNIDAZOLE 0.75 % cream ?Commonly known as: METROCREAM ?APPLY TWICE DAILY ?  ?multivitamin with minerals Tabs tablet ?Take 1 tablet by mouth daily. ?  ?neomycin-polymyxin b-dexamethasone 3.5-10000-0.1 Oint ?Commonly known as: MAXITROL ?APPLY 3 TIMES DAILY AS DIRECTED ?  ?omeprazole 20 MG capsule ?Commonly known as: PRILOSEC ?TAKE ONE (1) CAPSULE EACH DAY ?  ?polyethylene glycol 17 g packet ?Commonly known as: MIRALAX / GLYCOLAX ?Take 17 g by mouth daily. ?  ?ProAir RespiClick 856 (90 Base) MCG/ACT Aepb ?Generic drug: Albuterol Sulfate ?Inhale 2 puffs into the lungs every 6 (six) hours as needed (for wheezing). ?  ?senna-docusate 8.6-50 MG tablet ?Commonly known as: Senokot-S ?Take 1 tablet by mouth at bedtime. ?  ?tiZANidine 4 MG tablet ?Commonly known as: ZANAFLEX ?TAKE 1 TABLET EVERY 8 HOURS AS NEEDED FOR MUSCLE SPASMS ?  ?valsartan 160 MG tablet ?Commonly known as: DIOVAN ?TAKE ONE (1) TABLET BY MOUTH EVERY DAY ?  ? ?  ? ? ? ?Follow-up: Return if symptoms worsen or fail to improve. ? ?Claretta Fraise, M.D. ?

## 2021-09-11 ENCOUNTER — Telehealth: Payer: Self-pay | Admitting: Family Medicine

## 2021-09-11 NOTE — Telephone Encounter (Signed)
[facility] called stating that they received pts referral but says they don't do braces there and says the doctor said pts referral needs to be sent to [facility] with Dr Neldon Mc.  ?

## 2021-09-16 ENCOUNTER — Other Ambulatory Visit: Payer: Self-pay | Admitting: Family Medicine

## 2021-09-16 DIAGNOSIS — M41115 Juvenile idiopathic scoliosis, thoracolumbar region: Secondary | ICD-10-CM

## 2021-10-01 ENCOUNTER — Ambulatory Visit (INDEPENDENT_AMBULATORY_CARE_PROVIDER_SITE_OTHER): Payer: Medicare Other | Admitting: Family Medicine

## 2021-10-01 ENCOUNTER — Encounter: Payer: Self-pay | Admitting: Family Medicine

## 2021-10-01 VITALS — BP 140/67 | HR 69 | Temp 98.1°F | Ht 62.0 in | Wt 152.2 lb

## 2021-10-01 DIAGNOSIS — I10 Essential (primary) hypertension: Secondary | ICD-10-CM | POA: Diagnosis not present

## 2021-10-01 DIAGNOSIS — E785 Hyperlipidemia, unspecified: Secondary | ICD-10-CM

## 2021-10-01 MED ORDER — AZELASTINE HCL 0.05 % OP SOLN: 1.0000 [drp] | Freq: Two times a day (BID) | OPHTHALMIC | 12 refills | Status: AC

## 2021-10-01 NOTE — Progress Notes (Signed)
? ?Subjective:  ?Patient ID: Savannah Dean, female    DOB: [birth year]  Age: 76 y.o. MRN: [id number] ? ?CC: Medical Management of Chronic Issues ? ? ?HPI ?Savannah Dean presents for  follow-up of hypertension. Patient has no history of headache chest pain or shortness of breath or recent cough. Patient also denies symptoms of TIA such as focal numbness or weakness. Patient denies side effects from medication. States taking it regularly. ? ? in for follow-up of elevated cholesterol. Doing well without complaints on current medication. Denies side effects of statin including myalgia and arthralgia and nausea. Currently no chest pain, shortness of breath or other cardiovascular related symptoms noted. ? ? ? ?History ?Savannah Dean has a past medical history of Arthritis, Depression, HOH (hard of hearing), and Hypertension.  ? ?Savannah Dean has a past surgical history that includes Spinal fusion (1950); No past surgeries; and Intramedullary (im) nail intertrochanteric (Left, 12/10/2019).  ? ?Savannah Dean family history includes Diabetes in Savannah Dean brother and maternal grandfather; Hypertension in Savannah Dean father and mother.Savannah Dean reports that Savannah Dean has never smoked. Savannah Dean has never used smokeless tobacco. Savannah Dean reports that Savannah Dean does not drink alcohol and does not use drugs. ? ?Current Outpatient Medications on File Prior to Visit  ?Medication Sig Dispense Refill  ? acetaminophen (TYLENOL) 325 MG tablet Take 1-2 tablets (325-650 mg total) by mouth every 6 (six) hours as needed for mild pain (pain score 1-3 or temp > 100.5).    ? alendronate (FOSAMAX) 70 MG tablet Take 1 tablet (70 mg total) by mouth every Saturday. 12 tablet 3  ? aspirin 325 MG EC tablet Take 1 tablet (325 mg total) by mouth daily with breakfast. 30 tablet 0  ? chlorhexidine (PERIDEX) 0.12 % solution SMARTSIG:By Mouth    ? citalopram (CELEXA) 40 MG tablet Take 1.5 tablets (60 mg total) by mouth daily. 135 tablet 3  ? diclofenac sodium (VOLTAREN) 1 % GEL Apply 4 g topically 4 (four) times  daily. 400 g 2  ? docusate sodium (COLACE) 100 MG capsule Take 1 capsule (100 mg total) by mouth 2 (two) times daily. 10 capsule 0  ? EPINEPHrine 0.3 mg/0.3 mL IJ SOAJ injection Inject 0.3 mg into the muscle as needed for anaphylaxis.     ? fluticasone (FLONASE) 50 MCG/ACT nasal spray Place 1 spray into both nostrils daily as needed for allergies. 16 g 11  ? gabapentin (NEURONTIN) 100 MG capsule Take 1 capsule (100 mg total) by mouth 2 (two) times daily as needed. 180 capsule 3  ? ketoconazole (NIZORAL) 2 % cream APPLY TWICE DAILY 60 g 0  ? levocetirizine (XYZAL) 5 MG tablet Take 1 tablet (5 mg total) by mouth every evening. 90 tablet 3  ? loratadine-pseudoephedrine (CLARITIN-D 12 HOUR) 5-120 MG tablet Take 1 tablet by mouth 2 (two) times daily. 180 tablet 3  ? meloxicam (MOBIC) 15 MG tablet TAKE ONE (1) TABLET EACH DAY 30 tablet 0  ? methocarbamol (ROBAXIN) 500 MG tablet Take 1 tablet (500 mg total) by mouth every 6 (six) hours as needed. 40 tablet 1  ? metroNIDAZOLE (METROCREAM) 0.75 % cream APPLY TWICE DAILY 45 g 5  ? Multiple Vitamin (MULTIVITAMIN WITH MINERALS) TABS tablet Take 1 tablet by mouth daily.    ? neomycin-polymyxin b-dexamethasone (MAXITROL) 3.5-10000-0.1 OINT APPLY 3 TIMES DAILY AS DIRECTED 3.5 g 0  ? omeprazole (PRILOSEC) 20 MG capsule TAKE ONE (1) CAPSULE EACH DAY 90 capsule 2  ? polyethylene glycol (MIRALAX / GLYCOLAX) 17 g packet Take 17 g by  mouth daily. 14 each 0  ? PROAIR RESPICLICK 093 (90 Base) MCG/ACT AEPB Inhale 2 puffs into the lungs every 6 (six) hours as needed (for wheezing).     ? senna-docusate (SENOKOT-S) 8.6-50 MG tablet Take 1 tablet by mouth at bedtime.    ? tiZANidine (ZANAFLEX) 4 MG tablet TAKE 1 TABLET EVERY 8 HOURS AS NEEDED FOR MUSCLE SPASMS 90 tablet 05  ? valsartan (DIOVAN) 160 MG tablet TAKE ONE (1) TABLET BY MOUTH EVERY DAY 90 tablet 2  ? ?No current facility-administered medications on file prior to visit.  ? ? ?ROS ?Review of Systems  ?Constitutional: Negative.    ?HENT: Negative.    ?Eyes:  Negative for visual disturbance.  ?Respiratory:  Negative for shortness of breath.   ?Cardiovascular:  Negative for chest pain.  ?Gastrointestinal:  Negative for abdominal pain.  ?Musculoskeletal:  Positive for arthralgias (left flank, chronic for one year due to scoliosis).  ? ?Objective:  ?BP 140/67   Pulse 69   Temp 98.1 ?F (36.7 ?C)   Ht '5\' 2"'  (1.575 m)   Wt 152 lb 3.2 oz (69 kg)   SpO2 95%   BMI 27.84 kg/m?  ? ?BP Readings from Last 3 Encounters:  ?10/01/21 140/67  ?09/09/21 138/69  ?04/08/21 (!) 152/83  ? ? ?Wt Readings from Last 3 Encounters:  ?10/01/21 152 lb 3.2 oz (69 kg)  ?09/09/21 153 lb 3.2 oz (69.5 kg)  ?04/08/21 149 lb 9.6 oz (67.9 kg)  ? ? ? ?Physical Exam ?Constitutional:   ?   General: Savannah Dean is not in acute distress. ?   Appearance: Savannah Dean is well-developed.  ?HENT:  ?   Head: Normocephalic and atraumatic.  ?Eyes:  ?   Conjunctiva/sclera: Conjunctivae normal.  ?   Pupils: Pupils are equal, round, and reactive to light.  ?Neck:  ?   Thyroid: No thyromegaly.  ?Cardiovascular:  ?   Rate and Rhythm: Normal rate and regular rhythm.  ?   Heart sounds: Normal heart sounds. No murmur heard. ?Pulmonary:  ?   Effort: Pulmonary effort is normal. No respiratory distress.  ?   Breath sounds: Normal breath sounds. No wheezing or rales.  ?Abdominal:  ?   General: Bowel sounds are normal. There is no distension.  ?   Palpations: Abdomen is soft.  ?   Tenderness: There is no abdominal tenderness.  ?Musculoskeletal:     ?   General: Normal range of motion.  ?   Cervical back: Normal range of motion and neck supple.  ?Lymphadenopathy:  ?   Cervical: No cervical adenopathy.  ?Skin: ?   General: Skin is warm and dry.  ?Neurological:  ?   Mental Status: Savannah Dean is alert and oriented to person, place, and time.  ?Psychiatric:     ?   Behavior: Behavior normal.     ?   Thought Content: Thought content normal.     ?   Judgment: Judgment normal.  ? ? ? ? ?Assessment & Plan:  ? ?Savannah Dean was seen  today for medical management of chronic issues. ? ?Diagnoses and all orders for this visit: ? ?Hyperlipidemia, unspecified hyperlipidemia type ?-     Lipid panel ? ?Essential hypertension ?-     CBC with Differential/Platelet ?-     CMP14+EGFR ? ?Other orders ?-     azelastine (OPTIVAR) 0.05 % ophthalmic solution; Place 1 drop into both eyes 2 (two) times daily. ? ? ?Allergies as of 10/01/2021   ? ?   Reactions  ?  Molds & Smuts   ? ?  ? ?  ?Medication List  ?  ? ?  ? Accurate as of Oct 01, 2021 11:59 PM. If you have any questions, ask your nurse or doctor.  ?  ?  ? ?  ? ?acetaminophen 325 MG tablet ?Commonly known as: TYLENOL ?Take 1-2 tablets (325-650 mg total) by mouth every 6 (six) hours as needed for mild pain (pain score 1-3 or temp > 100.5). ?  ?alendronate 70 MG tablet ?Commonly known as: FOSAMAX ?Take 1 tablet (70 mg total) by mouth every Saturday. ?  ?aspirin 325 MG EC tablet ?Take 1 tablet (325 mg total) by mouth daily with breakfast. ?  ?azelastine 0.05 % ophthalmic solution ?Commonly known as: OPTIVAR ?Place 1 drop into both eyes 2 (two) times daily. ?Started by: Claretta Fraise, MD ?  ?chlorhexidine 0.12 % solution ?Commonly known as: PERIDEX ?SMARTSIG:By Mouth ?  ?citalopram 40 MG tablet ?Commonly known as: CELEXA ?Take 1.5 tablets (60 mg total) by mouth daily. ?  ?Claritin-D 12 Hour 5-120 MG tablet ?Generic drug: loratadine-pseudoephedrine ?Take 1 tablet by mouth 2 (two) times daily. ?  ?diclofenac sodium 1 % Gel ?Commonly known as: VOLTAREN ?Apply 4 g topically 4 (four) times daily. ?  ?docusate sodium 100 MG capsule ?Commonly known as: COLACE ?Take 1 capsule (100 mg total) by mouth 2 (two) times daily. ?  ?EPINEPHrine 0.3 mg/0.3 mL Soaj injection ?Commonly known as: EPI-PEN ?Inject 0.3 mg into the muscle as needed for anaphylaxis. ?  ?fluticasone 50 MCG/ACT nasal spray ?Commonly known as: FLONASE ?Place 1 spray into both nostrils daily as needed for allergies. ?  ?gabapentin 100 MG capsule ?Commonly  known as: NEURONTIN ?Take 1 capsule (100 mg total) by mouth 2 (two) times daily as needed. ?  ?ketoconazole 2 % cream ?Commonly known as: NIZORAL ?APPLY TWICE DAILY ?  ?levocetirizine 5 MG tablet ?Commonly known as: Harlow Ohms

## 2021-10-02 LAB — CMP14+EGFR
ALT: 16 IU/L (ref 0–32)
AST: 23 IU/L (ref 0–40)
Albumin/Globulin Ratio: 1.8 (ref 1.2–2.2)
Albumin: 4.5 g/dL (ref 3.7–4.7)
Alkaline Phosphatase: 95 IU/L (ref 44–121)
BUN/Creatinine Ratio: 14 (ref 12–28)
BUN: 11 mg/dL (ref 8–27)
Bilirubin Total: 0.3 mg/dL (ref 0.0–1.2)
CO2: 22 mmol/L (ref 20–29)
Calcium: 9.4 mg/dL (ref 8.7–10.3)
Chloride: 100 mmol/L (ref 96–106)
Creatinine, Ser: 0.81 mg/dL (ref 0.57–1.00)
Globulin, Total: 2.5 g/dL (ref 1.5–4.5)
Glucose: 86 mg/dL (ref 70–99)
Potassium: 4.6 mmol/L (ref 3.5–5.2)
Sodium: 138 mmol/L (ref 134–144)
Total Protein: 7 g/dL (ref 6.0–8.5)
eGFR: 75 mL/min/{1.73_m2} (ref >59)

## 2021-10-02 LAB — CBC WITH DIFFERENTIAL/PLATELET
Basophils Absolute: 0.1 10*3/uL (ref 0.0–0.2)
Basos: 1 %
EOS (ABSOLUTE): 0.1 10*3/uL (ref 0.0–0.4)
Eos: 2 %
Hematocrit: 36.7 % (ref 34.0–46.6)
Hemoglobin: 12.4 g/dL (ref 11.1–15.9)
Immature Grans (Abs): 0 10*3/uL (ref 0.0–0.1)
Immature Granulocytes: 0 %
Lymphocytes Absolute: 1.7 10*3/uL (ref 0.7–3.1)
Lymphs: 24 %
MCH: 30.4 pg (ref 26.6–33.0)
MCHC: 33.8 g/dL (ref 31.5–35.7)
MCV: 90 fL (ref 79–97)
Monocytes Absolute: 0.6 10*3/uL (ref 0.1–0.9)
Monocytes: 9 %
Neutrophils Absolute: 4.3 10*3/uL (ref 1.4–7.0)
Neutrophils: 64 %
Platelets: 226 10*3/uL (ref 150–450)
RBC: 4.08 x10E6/uL (ref 3.77–5.28)
RDW: 13 % (ref 11.7–15.4)
WBC: 6.8 10*3/uL (ref 3.4–10.8)

## 2021-10-02 LAB — LIPID PANEL
Chol/HDL Ratio: 2.8 ratio (ref 0.0–4.4)
Cholesterol, Total: 168 mg/dL (ref 100–199)
HDL: 60 mg/dL (ref >39)
LDL Chol Calc (NIH): 89 mg/dL (ref 0–99)
Triglycerides: 106 mg/dL (ref 0–149)
VLDL Cholesterol Cal: 19 mg/dL (ref 5–40)

## 2021-10-05 ENCOUNTER — Encounter: Payer: Self-pay | Admitting: Family Medicine

## 2021-10-06 NOTE — Progress Notes (Signed)
Hello Yaa,  Your lab result is normal and/or stable.Some minor variations that are not significant are commonly marked abnormal, but do not represent any medical problem for you.  Best regards, Eryn Krejci, M.D.

## 2021-10-17 ENCOUNTER — Other Ambulatory Visit: Payer: Self-pay | Admitting: Family Medicine

## 2021-10-17 DIAGNOSIS — M41115 Juvenile idiopathic scoliosis, thoracolumbar region: Secondary | ICD-10-CM

## 2022-01-04 IMAGING — RF DG FEMUR 2+V*L*
1 series · 8 of 8 positions shown · non-contrast
Comparison: None.

CLINICAL DATA: Femoral fracture repair.

EXAM:
LEFT FEMUR 2 VIEWS

[Series 1: run · 8 of 8 slices shown]
[im 1/8]
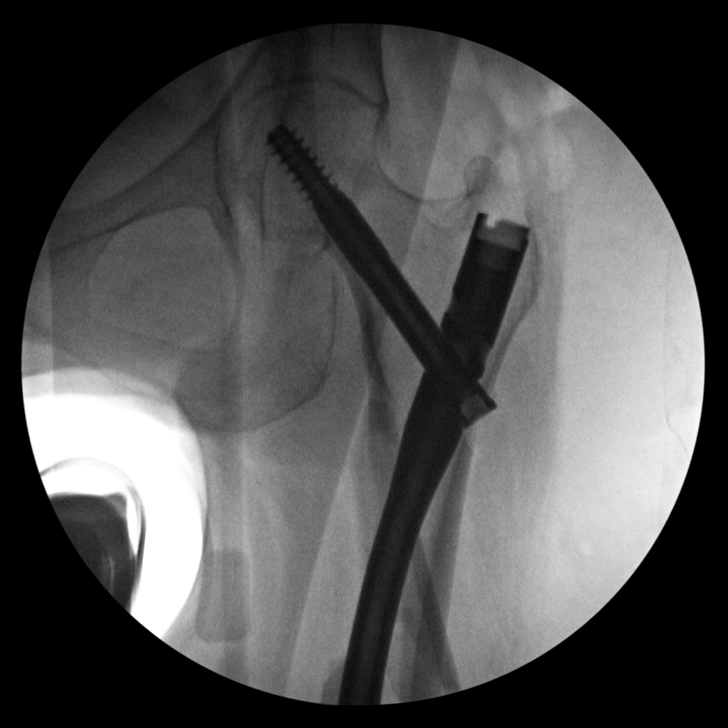
[im 2/8]
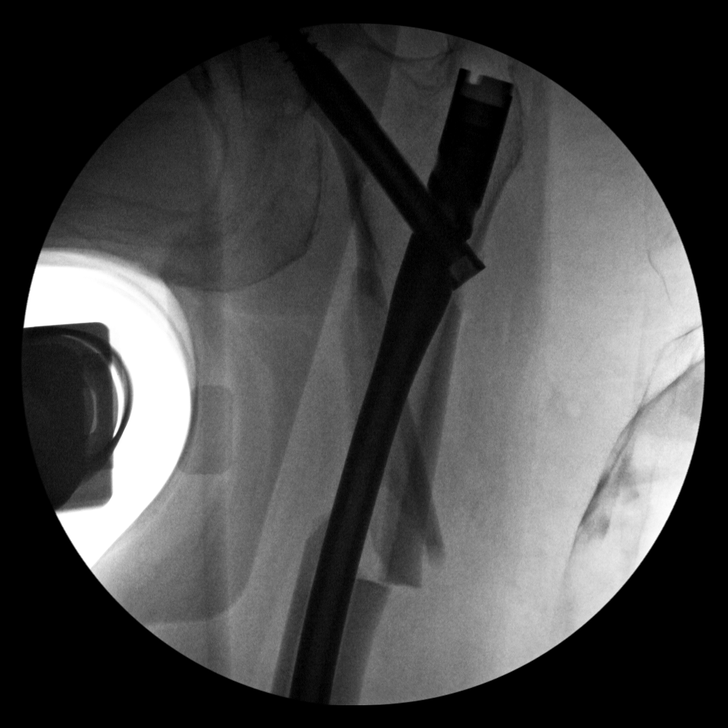
[im 3/8]
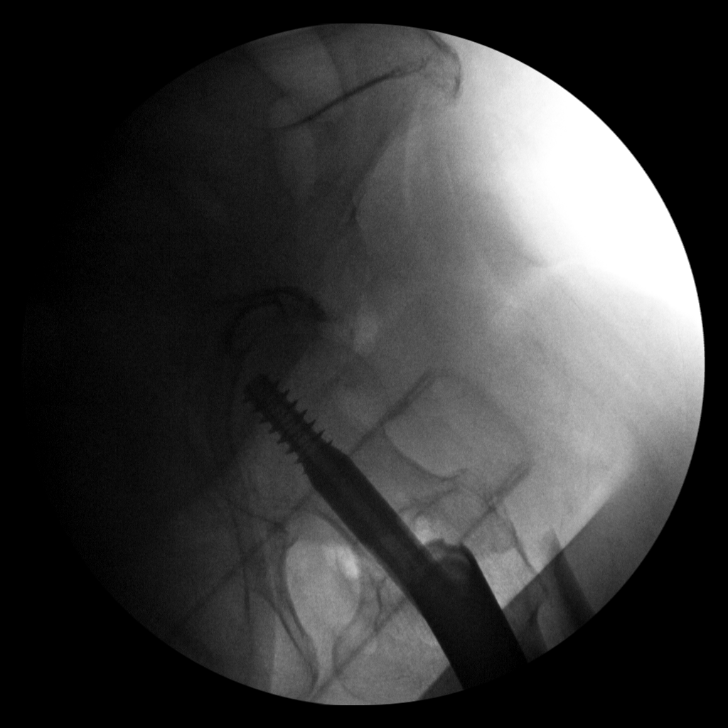
[im 4/8]
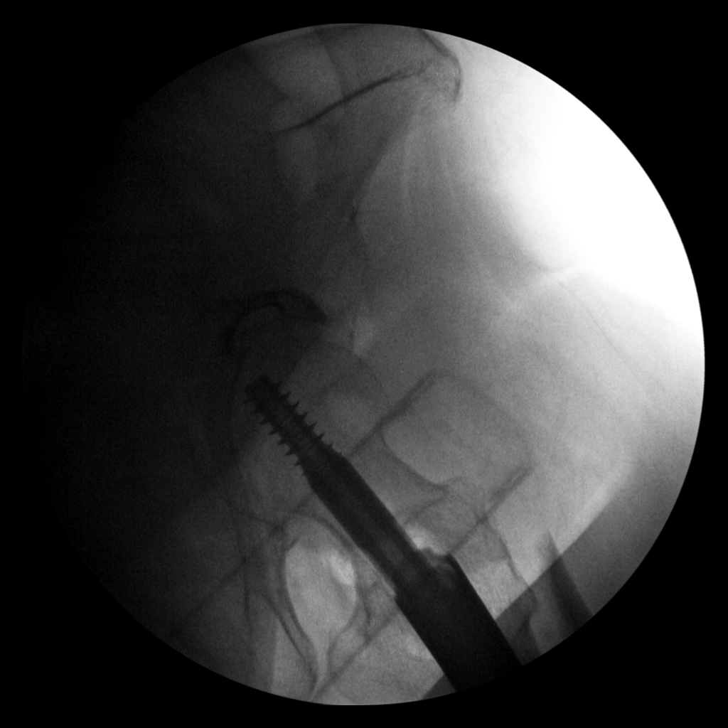
[im 5/8]
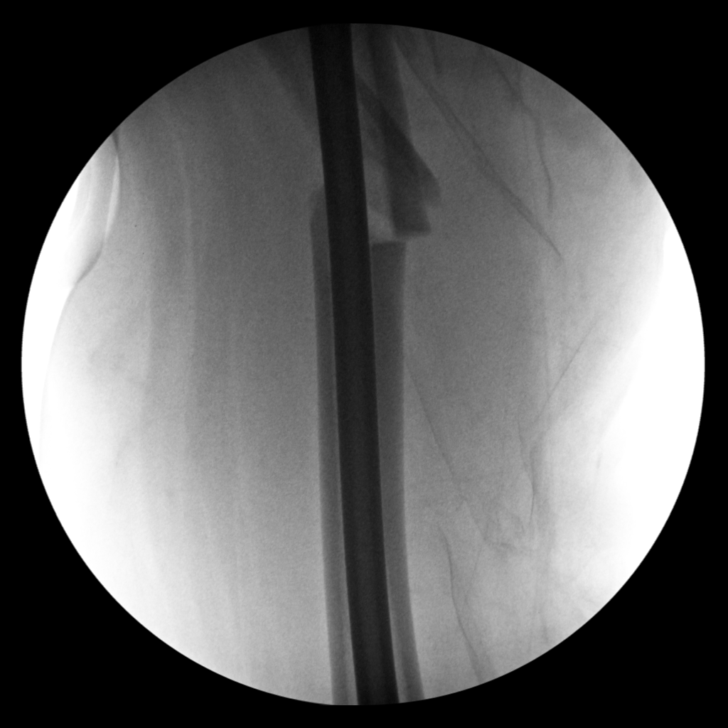
[im 6/8]
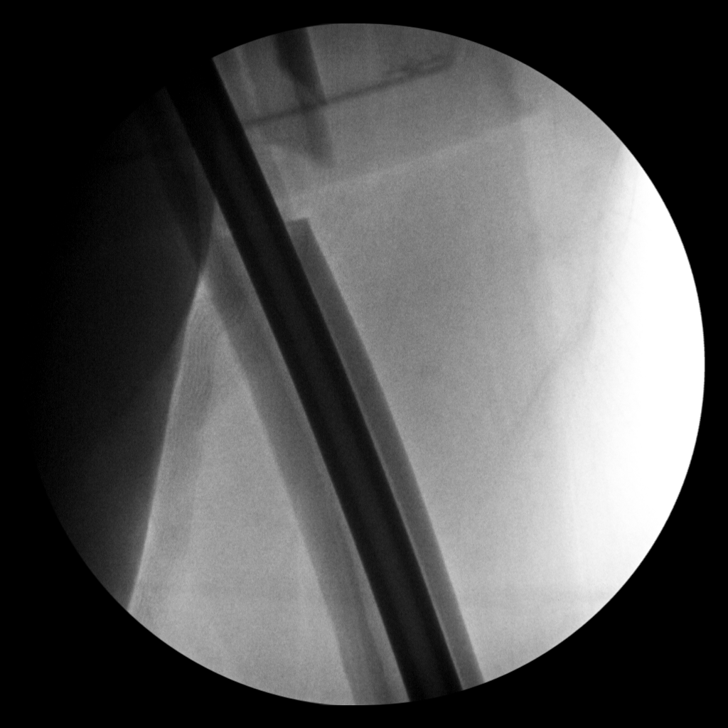
[im 7/8]
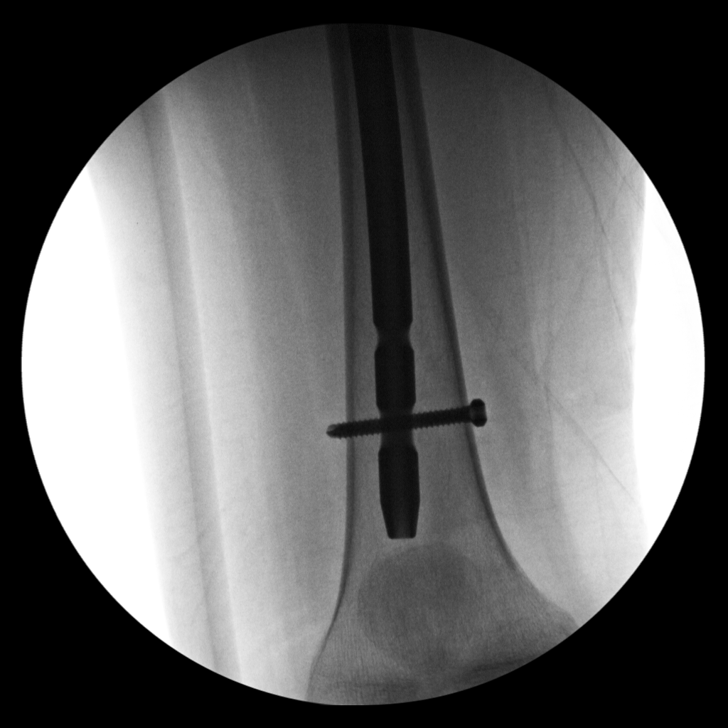
[im 8/8]
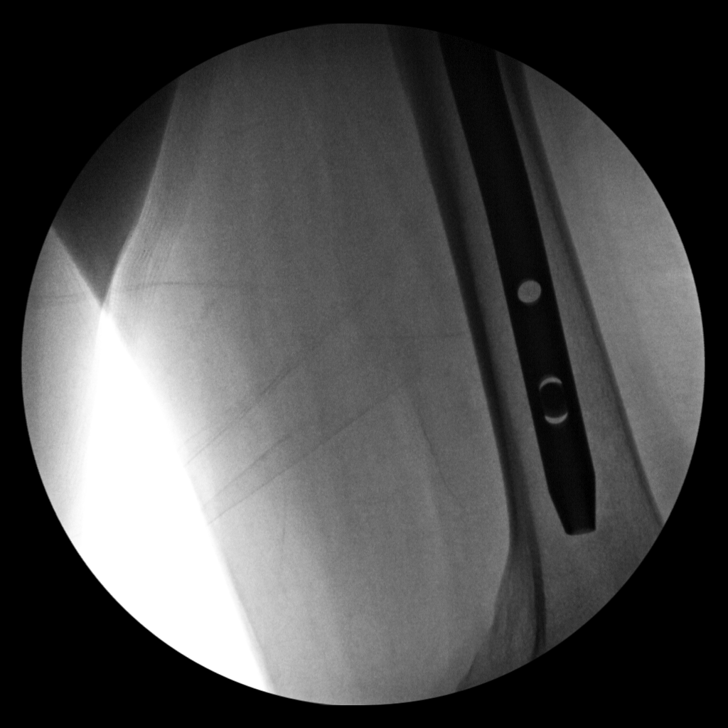

[8 of 8 positions shown; findings below may reference images not displayed]

FINDINGS: Throughout the study, a femoral fracture has been repaired with a
gamma nail and intramedullary femoral rod.
IMPRESSION: Fracture repair as above.

## 2022-01-04 IMAGING — RF DG C-ARM 1-60 MIN
1 series · 8 of 8 positions shown · non-contrast
Comparison: None.

CLINICAL DATA: Femur fracture repair.

EXAM:
DG C-ARM 1-60 MIN
FLUOROSCOPY TIME:  Fluoroscopy Time:  3 minutes and 24 seconds
Number of Acquired Spot Images: 8

[Series 1: run · 8 of 8 slices shown]
[im 1/8]
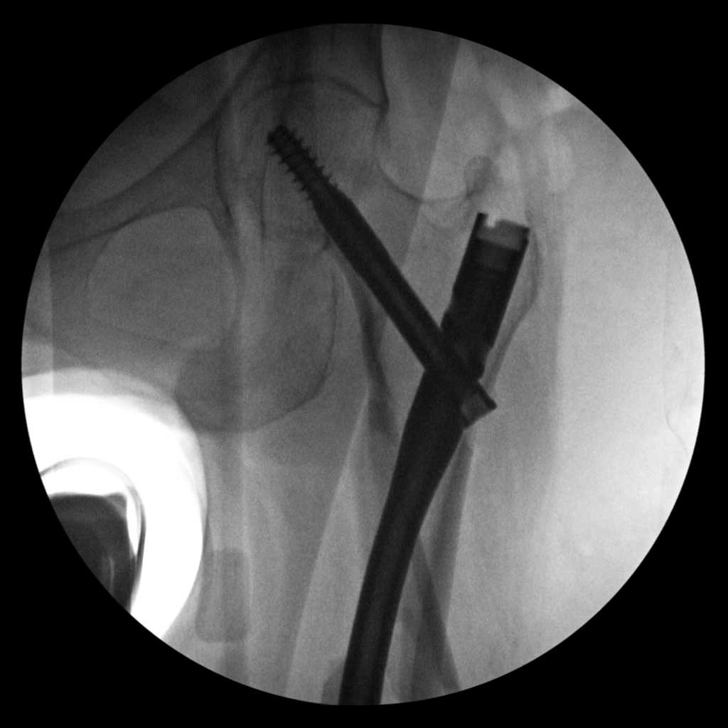
[im 2/8]
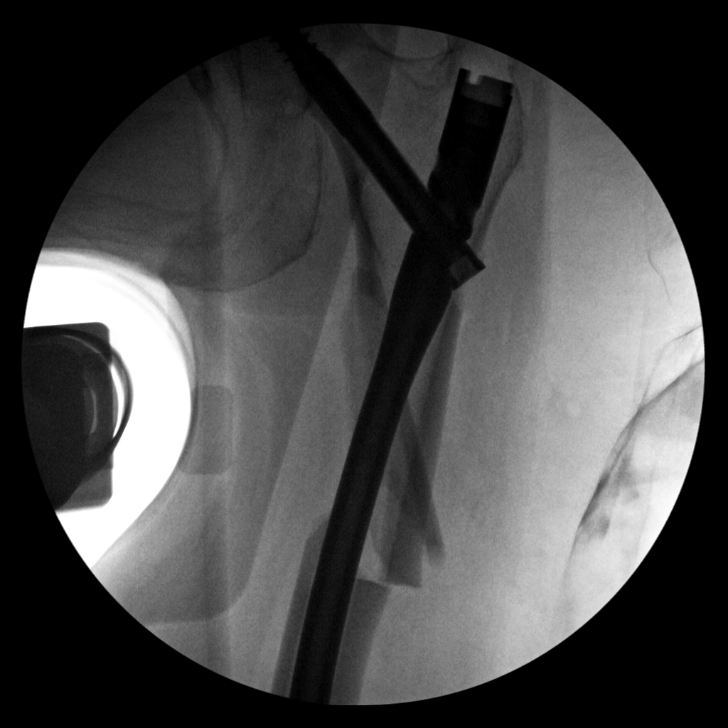
[im 3/8]
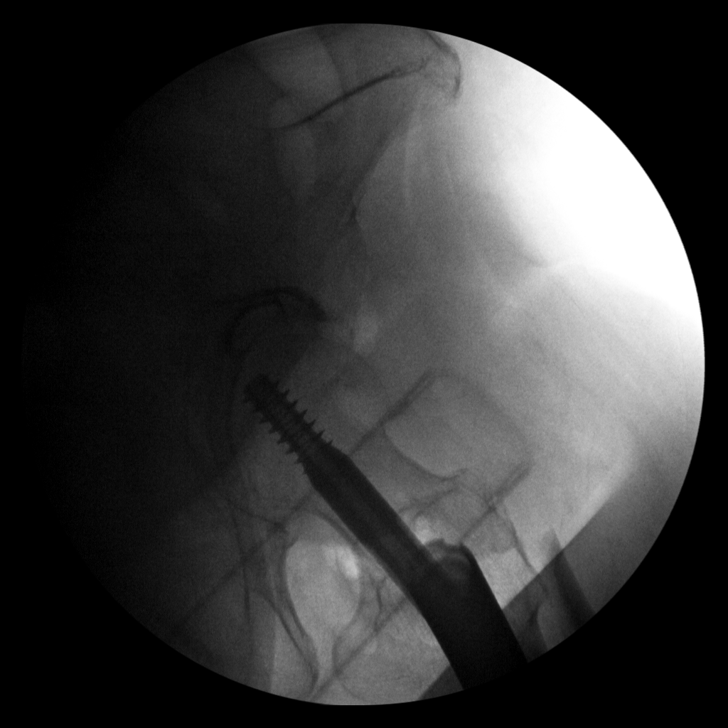
[im 4/8]
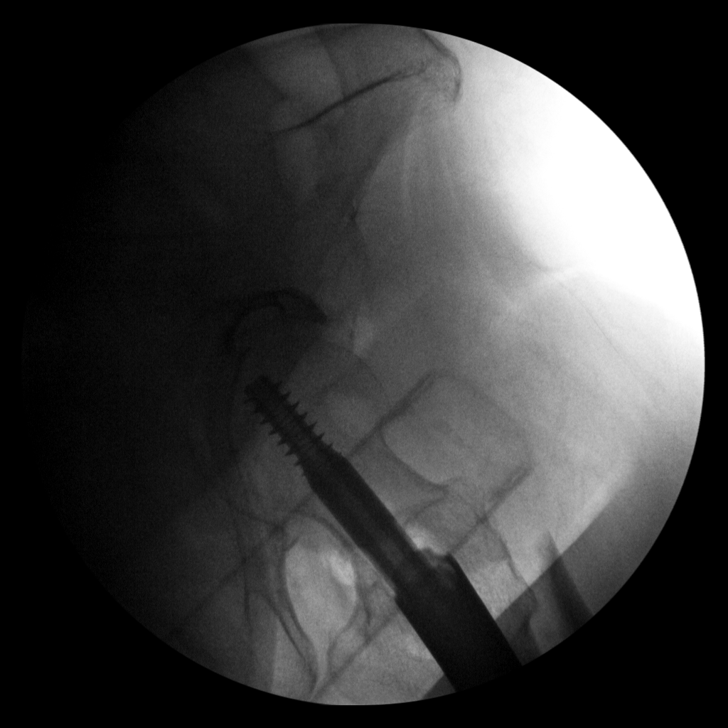
[im 5/8]
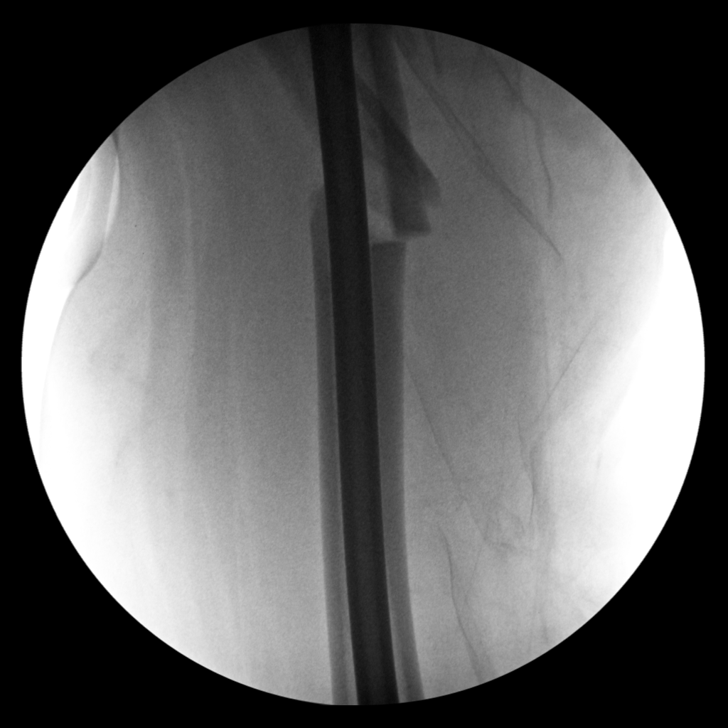
[im 6/8]
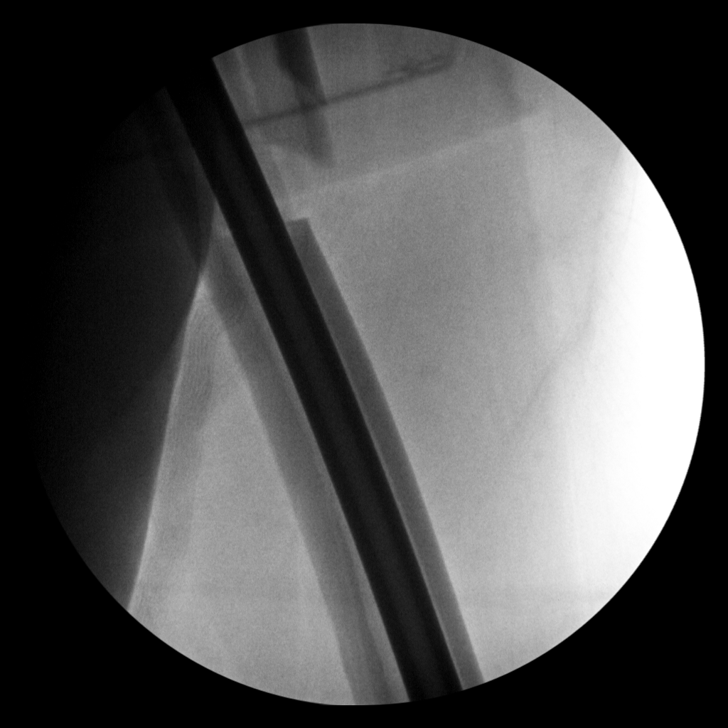
[im 7/8]
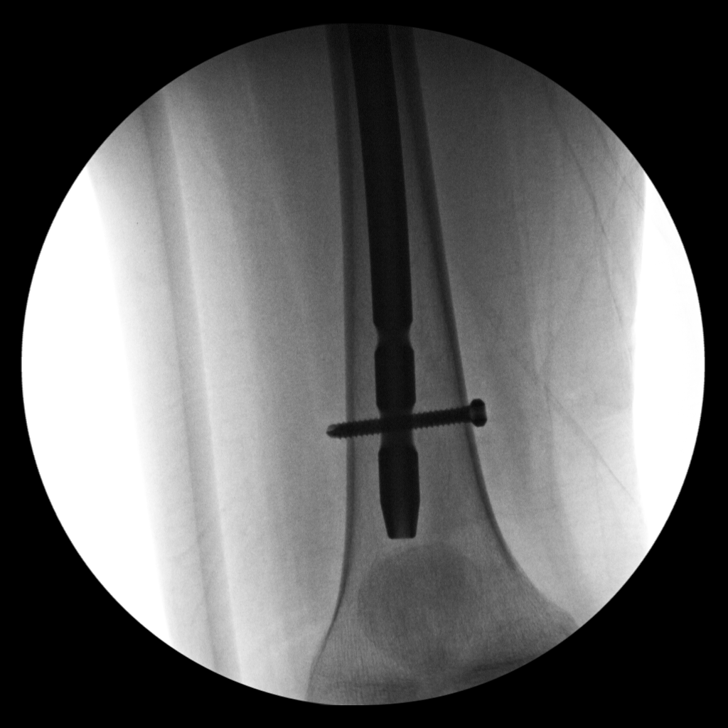
[im 8/8]
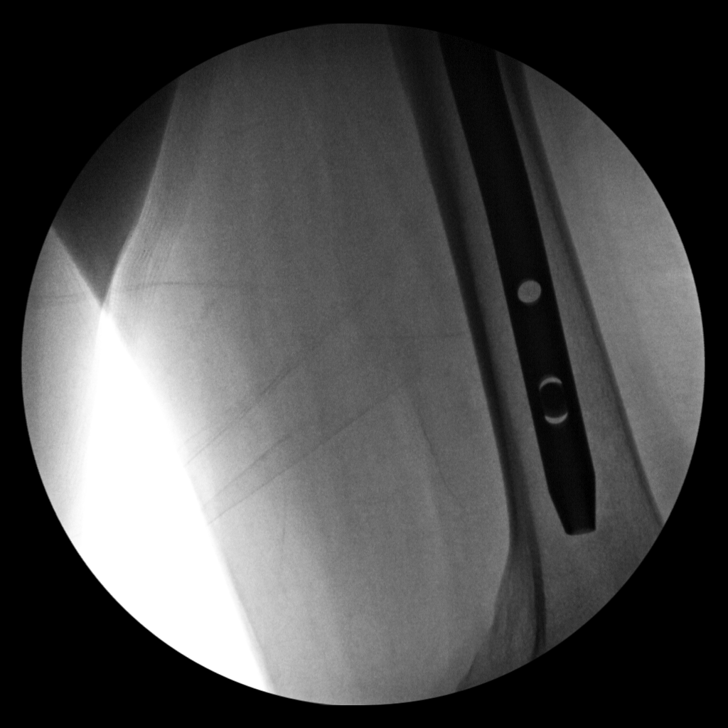

[8 of 8 positions shown; findings below may reference images not displayed]

FINDINGS: A gamma nail has been placed through the femoral neck. An
intramedullary rod has been placed through the femur, crossing the
fracture site. A distal interlocking screw is identified. No other
acute abnormalities.
IMPRESSION: Femoral fracture repair with a gamma nail and intramedullary femoral
rod as above.

## 2022-01-14 ENCOUNTER — Other Ambulatory Visit: Payer: Self-pay | Admitting: Family Medicine

## 2022-02-09 ENCOUNTER — Other Ambulatory Visit: Payer: Self-pay | Admitting: Family Medicine

## 2022-02-09 DIAGNOSIS — R200 Anesthesia of skin: Secondary | ICD-10-CM

## 2022-02-09 DIAGNOSIS — K219 Gastro-esophageal reflux disease without esophagitis: Secondary | ICD-10-CM

## 2022-02-24 ENCOUNTER — Ambulatory Visit (INDEPENDENT_AMBULATORY_CARE_PROVIDER_SITE_OTHER): Payer: Medicare Other

## 2022-02-24 DIAGNOSIS — Z0000 Encounter for general adult medical examination without abnormal findings: Secondary | ICD-10-CM

## 2022-02-24 NOTE — Progress Notes (Signed)
MEDICARE ANNUAL WELLNESS VISIT  02/24/2022  Telephone Visit Disclaimer This Medicare AWV was conducted by telephone due to national recommendations for restrictions regarding the COVID-19 Pandemic (e.g. social distancing).  I verified, using two identifiers, that I am speaking with Stacie Acres or their authorized healthcare agent. I discussed the limitations, risks, security, and privacy concerns of performing an evaluation and management service by telephone and the potential availability of an in-person appointment in the future. The patient expressed understanding and agreed to proceed.  Location of Patient: Home Location of Provider (nurse):  [facility]  Subjective:    RAYLIN DIGUGLIELMO is a 76 y.o. female patient of Stacks, Cletus Gash, MD who had a Medicare Annual Wellness Visit today via telephone. Javaeh is Retired and lives alone. She has two children. She reports that she is socially active and does interact with friends/family regularly. She is minimally physically active and enjoys attending church.  Patient Care Team: Claretta Fraise, MD as PCP - General (Family Medicine)     02/24/2022    9:08 AM 02/21/2021    9:09 AM 12/12/2019    9:10 PM 12/12/2019    8:40 AM 12/09/2019   10:49 PM  Advanced Directives  Does Patient Have a Medical Advance Directive? No No Yes  Yes  Type of Advance Directive   Living will    Does patient want to make changes to medical advance directive?   No - Patient declined No - Patient declined No - Patient declined  Would patient like information on creating a medical advance directive? No - Patient declined No - Patient declined       Hospital Utilization Over the Past 12 Months: # of hospitalizations or ER visits: 0 # of surgeries: 0  Review of Systems    Patient reports that her overall health is unchanged compared to last year.  History obtained from chart review and the patient  Patient Reported Readings (BP, Pulse, CBG, Weight,  etc) none  Pain Assessment Pain : No/denies pain     Current Medications & Allergies (verified) Allergies as of 02/24/2022       Reactions   Molds & Smuts         Medication List        Accurate as of February 24, 2022  9:17 AM. If you have any questions, ask your nurse or doctor.          acetaminophen 325 MG tablet Commonly known as: TYLENOL Take 1-2 tablets (325-650 mg total) by mouth every 6 (six) hours as needed for mild pain (pain score 1-3 or temp > 100.5).   alendronate 70 MG tablet Commonly known as: FOSAMAX TAKE 1 TABLET BY MOUTH EVERY SATURDAY   aspirin EC 325 MG tablet Take 1 tablet (325 mg total) by mouth daily with breakfast.   azelastine 0.05 % ophthalmic solution Commonly known as: OPTIVAR Place 1 drop into both eyes 2 (two) times daily.   chlorhexidine 0.12 % solution Commonly known as: PERIDEX SMARTSIG:By Mouth   citalopram 40 MG tablet Commonly known as: CELEXA Take 1.5 tablets (60 mg total) by mouth daily.   Claritin-D 12 Hour 5-120 MG tablet Generic drug: loratadine-pseudoephedrine Take 1 tablet by mouth 2 (two) times daily.   diclofenac sodium 1 % Gel Commonly known as: VOLTAREN Apply 4 g topically 4 (four) times daily.   docusate sodium 100 MG capsule Commonly known as: COLACE Take 1 capsule (100 mg total) by mouth 2 (two) times daily.  EPINEPHrine 0.3 mg/0.3 mL Soaj injection Commonly known as: EPI-PEN Inject 0.3 mg into the muscle as needed for anaphylaxis.   fluticasone 50 MCG/ACT nasal spray Commonly known as: FLONASE Place 1 spray into both nostrils daily as needed for allergies.   gabapentin 100 MG capsule Commonly known as: NEURONTIN TAKE 1 CAPSULE BY MOUTH TWICE DAILY AS NEEDED   ketoconazole 2 % cream Commonly known as: NIZORAL APPLY TWICE DAILY   levocetirizine 5 MG tablet Commonly known as: XYZAL Take 1 tablet (5 mg total) by mouth every evening.   meloxicam 15 MG tablet Commonly known as:  MOBIC TAKE ONE (1) TABLET EACH DAY   methocarbamol 500 MG tablet Commonly known as: ROBAXIN Take 1 tablet (500 mg total) by mouth every 6 (six) hours as needed.   metroNIDAZOLE 0.75 % cream Commonly known as: METROCREAM APPLY TWICE DAILY   multivitamin with minerals Tabs tablet Take 1 tablet by mouth daily.   neomycin-polymyxin b-dexamethasone 3.5-10000-0.1 Oint Commonly known as: MAXITROL APPLY 3 TIMES DAILY AS DIRECTED   omeprazole 20 MG capsule Commonly known as: PRILOSEC TAKE ONE (1) CAPSULE EACH DAY   polyethylene glycol 17 g packet Commonly known as: MIRALAX / GLYCOLAX Take 17 g by mouth daily.   ProAir RespiClick 932 (90 Base) MCG/ACT Aepb Generic drug: Albuterol Sulfate Inhale 2 puffs into the lungs every 6 (six) hours as needed (for wheezing).   senna-docusate 8.6-50 MG tablet Commonly known as: Senokot-S Take 1 tablet by mouth at bedtime.   tiZANidine 4 MG tablet Commonly known as: ZANAFLEX TAKE 1 TABLET EVERY 8 HOURS AS NEEDED FOR MUSCLE SPASMS   valsartan 160 MG tablet Commonly known as: DIOVAN TAKE ONE (1) TABLET BY MOUTH EVERY DAY        History (reviewed): Past Medical History:  Diagnosis Date   Arthritis    Depression    HOH (hard of hearing)    Hypertension    Past Surgical History:  Procedure Laterality Date   INTRAMEDULLARY (IM) NAIL INTERTROCHANTERIC Left 12/10/2019   Procedure: INTRAMEDULLARY (IM) NAIL SUBTROCHANTRIC;  Surgeon: Mcarthur Rossetti, MD;  Location: [place];  Service: Orthopedics;  Laterality: Left;   NO PAST SURGERIES     SPINAL FUSION  1950   Family History  Problem Relation Age of Onset   Hypertension Mother    Hypertension Father    Diabetes Brother    Diabetes Maternal Grandfather    Social History   Socioeconomic History   Marital status: Divorced    Spouse name: Not on file   Number of children: 2   Years of education: Not on file   Highest education level: Not on file  Occupational History    Occupation: retired  Tobacco Use   Smoking status: Never   Smokeless tobacco: Never  Vaping Use   Vaping Use: Never used  Substance and Sexual Activity   Alcohol use: Never   Drug use: Never   Sexual activity: Not on file  Other Topics Concern   Not on file  Social History Narrative   ** Merged History Encounter ** Lives alone in an apartment with elevator   Has 2 daughters - both live out of state   Granddaughter lives nearby.    Social Determinants of Health   Financial Resource Strain: Low Risk  (02/21/2021)   Overall Financial Resource Strain (CARDIA)    Difficulty of Paying Living Expenses: Not hard at all  Food Insecurity: No Food Insecurity (02/21/2021)   Hunger Vital Sign  Worried About Charity fundraiser in the Last Year: Never true    [place] in the Last Year: Never true  Transportation Needs: No Transportation Needs (02/21/2021)   PRAPARE - Hydrologist (Medical): No    Lack of Transportation (Non-Medical): No  Physical Activity: Insufficiently Active (02/21/2021)   Exercise Vital Sign    Days of Exercise per Week: 7 days    Minutes of Exercise per Session: 20 min  Stress: No Stress Concern Present (02/21/2021)   [place]    Feeling of Stress : Not at all  Social Connections: Moderately Integrated (02/21/2021)   Social Connection and Isolation Panel [NHANES]    Frequency of Communication with Friends and Family: More than three times a week    Frequency of Social Gatherings with Friends and Family: More than three times a week    Attends Religious Services: More than 4 times per year    Active Member of [employer] or Organizations: Yes    Attends Archivist Meetings: More than 4 times per year    Marital Status: Divorced    Activities of Daily Living    02/24/2022    9:09 AM  In your present state of health, do you have any difficulty performing the  following activities:  Hearing? 1  Vision? 0  Difficulty concentrating or making decisions? 0  Walking or climbing stairs? 1  Dressing or bathing? 0  Doing errands, shopping? 0  Preparing Food and eating ? N  Using the Toilet? N  In the past six months, have you accidently leaked urine? N  Do you have problems with loss of bowel control? N  Managing your Medications? N  Managing your Finances? N  Housekeeping or managing your Housekeeping? N   Patient has noticed hearing loss as she has aged.  When she is walking she uses a walker to help with balance and stability.    Patient Education/ Literacy How often do you need to have someone help you when you read instructions, pamphlets, or other written materials from your doctor or pharmacy?: 1 - Never What is the last grade level you completed in school?: GED  Exercise Current Exercise Habits: The patient does not participate in regular exercise at present, Exercise limited by: orthopedic condition(s)  Diet Patient reports consuming 2 meals a day and 1 snack(s) a day Patient reports that her primary diet is: Regular Patient reports that she does have regular access to food.   Depression Screen    02/24/2022    9:16 AM 10/01/2021    3:04 PM 09/09/2021   11:56 AM 04/08/2021    1:05 PM 02/21/2021    9:07 AM 10/02/2020    1:01 PM 10/02/2020   12:53 PM  PHQ 2/9 Scores  PHQ - 2 Score 0 0 0 0 0 0 0  PHQ- 9 Score      0      Fall Risk    02/24/2022    9:14 AM 10/01/2021    3:04 PM 09/09/2021   11:56 AM 04/08/2021    1:05 PM 02/21/2021    9:09 AM  Fall Risk   Falls in the past year? 0 0 0 0 0  Number falls in past yr:     0  Injury with Fall?     0  Risk for fall due to :     Medication side effect;History of fall(s);Impaired  balance/gait;Orthopedic patient  Follow up Falls evaluation completed    Education provided;Falls prevention discussed     Objective:  MARLYCE MCDOUGALD seemed alert and oriented and she participated  appropriately during our telephone visit.  Blood Pressure Weight BMI  BP Readings from Last 3 Encounters:  10/01/21 140/67  09/09/21 138/69  04/08/21 (!) 152/83   Wt Readings from Last 3 Encounters:  10/01/21 152 lb 3.2 oz (69 kg)  09/09/21 153 lb 3.2 oz (69.5 kg)  04/08/21 149 lb 9.6 oz (67.9 kg)   BMI Readings from Last 1 Encounters:  10/01/21 27.84 kg/m    *Unable to obtain current vital signs, weight, and BMI due to telephone visit type  Hearing/Vision  Jynesis did not seem to have difficulty with hearing/understanding during the telephone conversation Reports that she has not had a formal eye exam by an eye care professional within the past year Reports that she has not had a formal hearing evaluation within the past year *Unable to fully assess hearing and vision during telephone visit type  Cognitive Function:    02/24/2022    9:12 AM 02/21/2021    9:12 AM  6CIT Screen  What Year? 0 points 0 points  What month? 0 points 0 points  What time? 0 points 0 points  Count back from 20 0 points 0 points  Months in reverse 0 points 0 points  Repeat phrase 0 points 4 points  Total Score 0 points 4 points   (Normal:0-7, Significant for Dysfunction: >8)  Normal Cognitive Function Screening: Yes   Immunization & Health Maintenance Record Immunization History  Administered Date(s) Administered   Moderna Sars-Covid-2 Vaccination 09/11/2019, 10/09/2019, 05/07/2020    Health Maintenance  Topic Date Due   TETANUS/TDAP  Never done   Zoster Vaccines- Shingrix (1 of 2) Never done   DEXA SCAN  Never done   COVID-19 Vaccine (4 - Moderna series) 07/02/2020   Pneumonia Vaccine 44+ Years old (1 - PCV) 04/08/2022 (Originally 01/13/2009)   Hepatitis C Screening  04/08/2022 (Originally 01/13/1964)   INFLUENZA VACCINE  08/30/2022 (Originally 12/30/2021)   COLONOSCOPY (Pts 45-36yr Insurance coverage will need to be confirmed)  10/04/2024   HPV VACCINES  Aged Out       Assessment   This is a routine wellness examination for [facility]  Health Maintenance: Due or Overdue Health Maintenance Due  Topic Date Due   TETANUS/TDAP  Never done   Zoster Vaccines- Shingrix (1 of 2) Never done   DEXA SCAN  Never done   COVID-19 Vaccine (4 - Moderna series) 07/02/2020    MStacie Acresdoes not need a referral for Community Assistance: Care Management:   no Social Work:    no Prescription Assistance:  no Nutrition/Diabetes Education:  no   Plan:  Personalized Goals  Goals Addressed             This Visit's Progress    Patient Stated       02/24/2022 AWV Goal: Fall Prevention  Over the next year, patient will decrease their risk for falls by: Using assistive devices, such as a cane or walker, as needed Identifying fall risks within their home and correcting them by: Removing throw rugs Adding handrails to stairs or ramps Removing clutter and keeping a clear pathway throughout the home Increasing light, especially at night Adding shower handles/bars Raising toilet seat Identifying potential personal risk factors for falls: Medication side effects Incontinence/urgency Vestibular dysfunction Hearing loss Musculoskeletal disorders Neurological disorders Orthostatic  hypotension         Personalized Health Maintenance & Screening Recommendations  Influenza vaccine Td vaccine Bone densitometry screening Shingrix vaccine  Lung Cancer Screening Recommended: no (Low Dose CT Chest recommended if Age 23-80 years, 30 pack-year currently smoking OR have quit w/in past 15 years) Hepatitis C Screening recommended: no HIV Screening recommended: no  Advanced Directives: Written information was not prepared per patient's request.  Referrals & Orders No orders of the defined types were placed in this encounter.   Follow-up Plan Follow-up with Claretta Fraise, MD as planned Schedule Dexa Scan   I have personally reviewed and noted the following  in the patient's chart:   Medical and social history Use of alcohol, tobacco or illicit drugs  Current medications and supplements Functional ability and status Nutritional status Physical activity Advanced directives List of other physicians Hospitalizations, surgeries, and ER visits in previous 12 months Vitals Screenings to include cognitive, depression, and falls Referrals and appointments  In addition, I have reviewed and discussed with Stacie Acres certain preventive protocols, quality metrics, and best practice recommendations. A written personalized care plan for preventive services as well as general preventive health recommendations is available and can be mailed to the patient at her request.      Burnadette Pop  02/24/2022     Patient declined after visit summary

## 2022-03-03 ENCOUNTER — Other Ambulatory Visit: Payer: Self-pay | Admitting: Family Medicine

## 2022-04-06 ENCOUNTER — Encounter: Payer: Self-pay | Admitting: Family Medicine

## 2022-04-06 ENCOUNTER — Ambulatory Visit (INDEPENDENT_AMBULATORY_CARE_PROVIDER_SITE_OTHER): Payer: Medicare Other | Admitting: Family Medicine

## 2022-04-06 VITALS — BP 139/78 | HR 78 | Temp 98.3°F | Ht 62.0 in | Wt 149.8 lb

## 2022-04-06 DIAGNOSIS — I10 Essential (primary) hypertension: Secondary | ICD-10-CM | POA: Diagnosis not present

## 2022-04-06 DIAGNOSIS — E785 Hyperlipidemia, unspecified: Secondary | ICD-10-CM | POA: Diagnosis not present

## 2022-04-06 MED ORDER — ALENDRONATE SODIUM 70 MG PO TABS: ORAL_TABLET | ORAL | 3 refills | Status: DC

## 2022-04-06 MED ORDER — VALSARTAN 160 MG PO TABS: ORAL_TABLET | ORAL | 2 refills | Status: DC

## 2022-04-06 MED ORDER — CLARITIN-D 12 HOUR 5-120 MG PO TB12
1.0000 | ORAL_TABLET | Freq: Two times a day (BID) | ORAL | 3 refills | Status: DC
Start: 2022-04-06 — End: 2024-04-05

## 2022-04-06 MED ORDER — KETOCONAZOLE 2 % EX CREA
TOPICAL_CREAM | Freq: Two times a day (BID) | CUTANEOUS | 0 refills | Status: DC
Start: 2022-04-06 — End: 2023-02-08

## 2022-04-06 NOTE — Progress Notes (Signed)
Subjective:  Patient ID: Savannah Dean, female    DOB: 29-Nov-1945  Age: 76 y.o. MRN: [id number]  CC: Medical Management of Chronic Issues   HPI Savannah Dean presents for  presents for  follow-up of hypertension. Patient has no history of headache chest pain or shortness of breath or recent cough. Patient also denies symptoms of TIA such as focal numbness or weakness. Patient denies side effects from medication. States taking it regularly.      04/06/2022    1:44 PM 02/24/2022    9:16 AM 10/01/2021    3:04 PM  Depression screen PHQ 2/9  Decreased Interest 0 0 0  Down, Depressed, Hopeless 0 0 0  PHQ - 2 Score 0 0 0    History Savannah Dean has a past medical history of Arthritis, Depression, HOH (hard of hearing), and Hypertension.   Savannah Dean has a past surgical history that includes Spinal fusion (1950); No past surgeries; and Intramedullary (im) nail intertrochanteric (Left, 12/10/2019).   Her family history includes Diabetes in her brother and maternal grandfather; Hypertension in her father and mother.Savannah Dean reports that Savannah Dean has never smoked. Savannah Dean has never used smokeless tobacco. Savannah Dean reports that Savannah Dean does not drink alcohol and does not use drugs.    ROS Review of Systems  Constitutional: Negative.   HENT: Negative.    Eyes:  Negative for visual disturbance.  Respiratory:  Negative for shortness of breath.   Cardiovascular:  Negative for chest pain.  Gastrointestinal:  Negative for abdominal pain.  Musculoskeletal:  Negative for arthralgias.    Objective:  BP 139/78   Pulse 78   Temp 98.3 F (36.8 C)   Ht _0  (1.575 m)   Wt 149 lb 12.8 oz (67.9 kg)   SpO2 94%   BMI 27.40 kg/m   BP Readings from Last 3 Encounters:  04/06/22 139/78  10/01/21 140/67  09/09/21 138/69    Wt Readings from Last 3 Encounters:  04/06/22 149 lb 12.8 oz (67.9 kg)  10/01/21 152 lb 3.2 oz (69 kg)  09/09/21 153 lb 3.2 oz (69.5 kg)     Physical Exam Constitutional:      General: Savannah Dean is  not in acute distress.    Appearance: Savannah Dean is well-developed.  Cardiovascular:     Rate and Rhythm: Normal rate and regular rhythm.  Pulmonary:     Breath sounds: Normal breath sounds.  Musculoskeletal:        General: Normal range of motion.  Skin:    General: Skin is warm and dry.  Neurological:     Mental Status: Savannah Dean is alert and oriented to person, place, and time.       Assessment & Plan:   Savannah Dean was seen today for medical management of chronic issues.  Diagnoses and all orders for this visit:  Hyperlipidemia, unspecified hyperlipidemia type -     Lipid panel  Essential hypertension -     CBC with Differential/Platelet -     CMP14+EGFR  Other orders -     valsartan (DIOVAN) 160 MG tablet; TAKE ONE (1) TABLET BY MOUTH EVERY DAY -     alendronate (FOSAMAX) 70 MG tablet; TAKE 1 TABLET BY MOUTH EVERY SATURDAY -     ketoconazole (NIZORAL) 2 % cream; Apply topically 2 (two) times daily. -     loratadine-pseudoephedrine (CLARITIN-D 12 HOUR) 5-120 MG tablet; Take 1 tablet by mouth 2 (two) times daily.       I have discontinued [place] levocetirizine.  I have also changed her ketoconazole. Additionally, [name], EPINEPHrine, ProAir RespiClick, aspirin EC, multivitamin with minerals, docusate sodium, polyethylene glycol, senna-docusate, acetaminophen, fluticasone, methocarbamol, tiZANidine, chlorhexidine, citalopram, metroNIDAZOLE, neomycin-polymyxin b-dexamethasone, azelastine, meloxicam, omeprazole, gabapentin, valsartan, alendronate, and Claritin-D 12 Hour.  Allergies as of 04/06/2022       Reactions   Molds & Smuts         Medication List        Accurate as of April 06, 2022  5:35 PM. If you have any questions, ask your nurse or doctor.          STOP taking these medications    levocetirizine 5 MG tablet Commonly known as: XYZAL Stopped by: Claretta Fraise, MD       TAKE these medications     acetaminophen 325 MG tablet Commonly known as: TYLENOL Take 1-2 tablets (325-650 mg total) by mouth every 6 (six) hours as needed for mild pain (pain score 1-3 or temp > 100.5).   alendronate 70 MG tablet Commonly known as: FOSAMAX TAKE 1 TABLET BY MOUTH EVERY SATURDAY   aspirin EC 325 MG tablet Take 1 tablet (325 mg total) by mouth daily with breakfast.   azelastine 0.05 % ophthalmic solution Commonly known as: OPTIVAR Place 1 drop into both eyes 2 (two) times daily.   chlorhexidine 0.12 % solution Commonly known as: PERIDEX SMARTSIG:By Mouth   citalopram 40 MG tablet Commonly known as: CELEXA Take 1.5 tablets (60 mg total) by mouth daily.   Claritin-D 12 Hour 5-120 MG tablet Generic drug: loratadine-pseudoephedrine Take 1 tablet by mouth 2 (two) times daily.   diclofenac sodium 1 % Gel Commonly known as: VOLTAREN Apply 4 g topically 4 (four) times daily.   docusate sodium 100 MG capsule Commonly known as: COLACE Take 1 capsule (100 mg total) by mouth 2 (two) times daily.   EPINEPHrine 0.3 mg/0.3 mL Soaj injection Commonly known as: EPI-PEN Inject 0.3 mg into the muscle as needed for anaphylaxis.   fluticasone 50 MCG/ACT nasal spray Commonly known as: FLONASE Place 1 spray into both nostrils daily as needed for allergies.   gabapentin 100 MG capsule Commonly known as: NEURONTIN TAKE 1 CAPSULE BY MOUTH TWICE DAILY AS NEEDED   ketoconazole 2 % cream Commonly known as: NIZORAL Apply topically 2 (two) times daily.   meloxicam 15 MG tablet Commonly known as: MOBIC TAKE ONE (1) TABLET EACH DAY   methocarbamol 500 MG tablet Commonly known as: ROBAXIN Take 1 tablet (500 mg total) by mouth every 6 (six) hours as needed.   metroNIDAZOLE 0.75 % cream Commonly known as: METROCREAM APPLY TWICE DAILY   multivitamin with minerals Tabs tablet Take 1 tablet by mouth daily.   neomycin-polymyxin b-dexamethasone 3.5-10000-0.1 Oint Commonly known as: MAXITROL APPLY  3 TIMES DAILY AS DIRECTED   omeprazole 20 MG capsule Commonly known as: PRILOSEC TAKE ONE (1) CAPSULE EACH DAY   polyethylene glycol 17 g packet Commonly known as: MIRALAX / GLYCOLAX Take 17 g by mouth daily.   ProAir RespiClick 546 (90 Base) MCG/ACT Aepb Generic drug: Albuterol Sulfate Inhale 2 puffs into the lungs every 6 (six) hours as needed (for wheezing).   senna-docusate 8.6-50 MG tablet Commonly known as: Senokot-S Take 1 tablet by mouth at bedtime.   tiZANidine 4 MG tablet Commonly known as: ZANAFLEX TAKE 1 TABLET EVERY 8 HOURS AS NEEDED FOR MUSCLE SPASMS   valsartan 160 MG tablet Commonly known as: DIOVAN TAKE ONE (1) TABLET BY  MOUTH EVERY DAY What changed:  how to take this when to take this additional instructions Changed by: Claretta Fraise, MD         Follow-up: Return in about 6 months (around 10/05/2022).  Claretta Fraise, M.D.

## 2022-04-07 LAB — CBC WITH DIFFERENTIAL/PLATELET
Basophils Absolute: 0.1 10*3/uL (ref 0.0–0.2)
Basos: 1 %
EOS (ABSOLUTE): 0.2 10*3/uL (ref 0.0–0.4)
Eos: 2 %
Hematocrit: 37.1 % (ref 34.0–46.6)
Hemoglobin: 12.4 g/dL (ref 11.1–15.9)
Immature Grans (Abs): 0 10*3/uL (ref 0.0–0.1)
Immature Granulocytes: 0 %
Lymphocytes Absolute: 1.5 10*3/uL (ref 0.7–3.1)
Lymphs: 23 %
MCH: 29 pg (ref 26.6–33.0)
MCHC: 33.4 g/dL (ref 31.5–35.7)
MCV: 87 fL (ref 79–97)
Monocytes Absolute: 0.6 10*3/uL (ref 0.1–0.9)
Monocytes: 8 %
Neutrophils Absolute: 4.4 10*3/uL (ref 1.4–7.0)
Neutrophils: 66 %
Platelets: 217 10*3/uL (ref 150–450)
RBC: 4.28 x10E6/uL (ref 3.77–5.28)
RDW: 13.1 % (ref 11.7–15.4)
WBC: 6.8 10*3/uL (ref 3.4–10.8)

## 2022-04-07 LAB — CMP14+EGFR
ALT: 11 IU/L (ref 0–32)
AST: 24 IU/L (ref 0–40)
Albumin/Globulin Ratio: 1.8 (ref 1.2–2.2)
Albumin: 4.6 g/dL (ref 3.8–4.8)
Alkaline Phosphatase: 99 IU/L (ref 44–121)
BUN/Creatinine Ratio: 11 — ABNORMAL LOW (ref 12–28)
BUN: 10 mg/dL (ref 8–27)
Bilirubin Total: 0.4 mg/dL (ref 0.0–1.2)
CO2: 24 mmol/L (ref 20–29)
Calcium: 9.6 mg/dL (ref 8.7–10.3)
Chloride: 98 mmol/L (ref 96–106)
Creatinine, Ser: 0.9 mg/dL (ref 0.57–1.00)
Globulin, Total: 2.5 g/dL (ref 1.5–4.5)
Glucose: 85 mg/dL (ref 70–99)
Potassium: 4.6 mmol/L (ref 3.5–5.2)
Sodium: 136 mmol/L (ref 134–144)
Total Protein: 7.1 g/dL (ref 6.0–8.5)
eGFR: 65 mL/min/{1.73_m2} (ref >59)

## 2022-04-07 LAB — LIPID PANEL
Chol/HDL Ratio: 3.1 ratio (ref 0.0–4.4)
Cholesterol, Total: 157 mg/dL (ref 100–199)
HDL: 51 mg/dL (ref >39)
LDL Chol Calc (NIH): 79 mg/dL (ref 0–99)
Triglycerides: 155 mg/dL — ABNORMAL HIGH (ref 0–149)
VLDL Cholesterol Cal: 27 mg/dL (ref 5–40)

## 2022-04-09 NOTE — Progress Notes (Signed)
Hello Shakoya,  Your lab result is normal and/or stable.Some minor variations that are not significant are commonly marked abnormal, but do not represent any medical problem for you.  Best regards, Claretta Fraise, M.D.

## 2022-04-16 ENCOUNTER — Other Ambulatory Visit: Payer: Self-pay | Admitting: Family Medicine

## 2022-04-16 DIAGNOSIS — M41115 Juvenile idiopathic scoliosis, thoracolumbar region: Secondary | ICD-10-CM

## 2022-05-04 ENCOUNTER — Other Ambulatory Visit: Payer: Self-pay | Admitting: Family Medicine

## 2022-05-04 DIAGNOSIS — M41115 Juvenile idiopathic scoliosis, thoracolumbar region: Secondary | ICD-10-CM

## 2022-05-15 ENCOUNTER — Other Ambulatory Visit: Payer: Self-pay | Admitting: Family Medicine

## 2022-05-15 DIAGNOSIS — L719 Rosacea, unspecified: Secondary | ICD-10-CM

## 2022-05-21 ENCOUNTER — Other Ambulatory Visit: Payer: Self-pay | Admitting: Family Medicine

## 2022-05-21 ENCOUNTER — Telehealth: Payer: Self-pay | Admitting: Family Medicine

## 2022-05-21 DIAGNOSIS — R200 Anesthesia of skin: Secondary | ICD-10-CM

## 2022-05-21 MED ORDER — GABAPENTIN 100 MG PO CAPS: 200.0000 mg | ORAL_CAPSULE | Freq: Two times a day (BID) | ORAL | 1 refills | Status: DC

## 2022-05-21 NOTE — Telephone Encounter (Signed)
Patient wants to know if Dr Livia Snellen can send her in some medicine for her scoliosis until she can see the Orthopedic doctor on 06/10/22.

## 2022-05-21 NOTE — Telephone Encounter (Signed)
Take 2 gabapentin twice a day

## 2022-05-26 NOTE — Telephone Encounter (Signed)
Patient aware.

## 2022-06-10 ENCOUNTER — Ambulatory Visit (INDEPENDENT_AMBULATORY_CARE_PROVIDER_SITE_OTHER): Payer: BLUE CROSS/BLUE SHIELD

## 2022-06-10 ENCOUNTER — Ambulatory Visit: Payer: Self-pay

## 2022-06-10 ENCOUNTER — Ambulatory Visit (INDEPENDENT_AMBULATORY_CARE_PROVIDER_SITE_OTHER): Payer: BLUE CROSS/BLUE SHIELD | Admitting: Orthopaedic Surgery

## 2022-06-10 ENCOUNTER — Encounter: Payer: Self-pay | Admitting: Orthopaedic Surgery

## 2022-06-10 DIAGNOSIS — S7222XD Displaced subtrochanteric fracture of left femur, subsequent encounter for closed fracture with routine healing: Secondary | ICD-10-CM | POA: Diagnosis not present

## 2022-06-10 DIAGNOSIS — M25552 Pain in left hip: Secondary | ICD-10-CM

## 2022-06-10 MED ORDER — PREDNISONE 50 MG PO TABS: ORAL_TABLET | ORAL | 0 refills | Status: DC

## 2022-06-10 MED ORDER — TRAMADOL HCL 50 MG PO TABS: 50.0000 mg | ORAL_TABLET | Freq: Two times a day (BID) | ORAL | 0 refills | Status: DC | PRN

## 2022-06-10 NOTE — Progress Notes (Signed)
The patient is a 77 year old female who has a history of a subtrochanteric femur fracture on the left side that we treated with an intramedullary rod and hip screw back in July 2021.  At her 61-monthvisit she still showed signs of delayed union but was pain-free.  She has significantly arthritic lumbar spine.  We have not seen her now in well over a year and a half.  She comes in today ambulating with a walker and reporting left hip pain.  When I discussed in length in detail where she hurts she does not hurt in the left hip or groin or her left thigh.  She hurts in the lower back to the left side.  She has remote history of at least 2 spine surgeries I believe back in the 1950s where they actually took bone from her right tibia to fuse her spine.  This was a noninstrumented fusion.  On exam I can easily put her left hip through internal and external rotation with no pain in the groin at all.  Her left thigh does not hurt.  Her left knee exam is normal.  She has significant deformity of her lumbar spine on exam and pain to the left side that radiates in the sciatic region.  X-rays of the pelvis and left femur show the femur fracture is healed.  There is no significant arthritic changes in the left hip.  The lower aspect of the lumbar spine can be seen and there is severe degenerative changes of the left side.  At this point we will try a couple days of a steroid for her.  She is already on ibuprofen.  Will also start some tramadol for pain.  If this does not help we would recommend an outpatient MRI to help determine whether or not she would benefit from any type of epidural steroid injection or facet joint injection.

## 2022-06-16 ENCOUNTER — Other Ambulatory Visit: Payer: Self-pay | Admitting: Family Medicine

## 2022-06-16 DIAGNOSIS — F339 Major depressive disorder, recurrent, unspecified: Secondary | ICD-10-CM

## 2022-10-05 ENCOUNTER — Encounter: Payer: Self-pay | Admitting: Family Medicine

## 2022-10-05 ENCOUNTER — Ambulatory Visit (INDEPENDENT_AMBULATORY_CARE_PROVIDER_SITE_OTHER): Payer: Medicare Other | Admitting: Family Medicine

## 2022-10-05 VITALS — BP 164/79 | HR 95 | Temp 98.0°F | Ht 62.0 in | Wt 146.2 lb

## 2022-10-05 DIAGNOSIS — I10 Essential (primary) hypertension: Secondary | ICD-10-CM | POA: Diagnosis not present

## 2022-10-05 DIAGNOSIS — R200 Anesthesia of skin: Secondary | ICD-10-CM | POA: Diagnosis not present

## 2022-10-05 DIAGNOSIS — M41115 Juvenile idiopathic scoliosis, thoracolumbar region: Secondary | ICD-10-CM

## 2022-10-05 DIAGNOSIS — E785 Hyperlipidemia, unspecified: Secondary | ICD-10-CM | POA: Diagnosis not present

## 2022-10-05 MED ORDER — GABAPENTIN 300 MG PO CAPS: 300.0000 mg | ORAL_CAPSULE | Freq: Two times a day (BID) | ORAL | 1 refills | Status: DC

## 2022-10-05 MED ORDER — MELOXICAM 15 MG PO TABS
ORAL_TABLET | ORAL | 5 refills | Status: DC
Start: 2022-10-05 — End: 2023-04-07

## 2022-10-05 NOTE — Progress Notes (Signed)
Subjective:  Patient ID: Savannah Dean, female    DOB: 18-Aug-1945  Age: 77 y.o. MRN: [id number]  CC: Medical Management of Chronic Issues   HPI Savannah Dean presents for left sciatica from her scoliosis per ortho, Dr. Rayburn Ma. Not bothering her bad enough to get an MRI & ESI. Pain is only when she stands for a few minutes. Wants increase of tramadol.  Patient in for follow-up of GERD. Currently asymptomatic taking  PPI daily. There is no chest pain or heartburn. No hematemesis and no melena. No dysphagia or choking. Onset is remote. Progression is stable. Complicating factors, none.   presents for  follow-up of hypertension. Patient has no history of headache chest pain or shortness of breath or recent cough. Patient also denies symptoms of TIA such as focal numbness or weakness. Patient denies side effects from medication. States taking it regularly. Pt. Assures me her BP is 130/70 or so at home. Only high here.     10/05/2022    1:06 PM 04/06/2022    1:44 PM 02/24/2022    9:16 AM  Depression screen PHQ 2/9  Decreased Interest 0 0 0  Down, Depressed, Hopeless 0 0 0  PHQ - 2 Score 0 0 0    t Savannah Dean has a past medical history of Arthritis, Depression, HOH (hard of hearing), and Hypertension.   She has a past surgical history that includes Spinal fusion (1950); No past surgeries; and Intramedullary (im) nail intertrochanteric (Left, 12/10/2019).   Her family history includes Diabetes in her brother and maternal grandfather; Hypertension in her father and mother.She reports that she has never smoked. She has never used smokeless tobacco. She reports that she does not drink alcohol and does not use drugs.    ROS Review of Systems  Constitutional: Negative.   HENT: Negative.    Eyes:  Negative for visual disturbance.  Respiratory:  Negative for shortness of breath.   Cardiovascular:  Negative for chest pain.  Gastrointestinal:  Negative for abdominal pain.  Musculoskeletal:   Positive for arthralgias and back pain.    Objective:  BP (!) 164/79   Pulse 95   Temp 98 F (36.7 C)   Ht 5\' 2"  (1.575 m)   Wt 146 lb 3.2 oz (66.3 kg)   SpO2 93%   BMI 26.74 kg/m   BP Readings from Last 3 Encounters:  10/05/22 (!) 164/79  04/06/22 139/78  10/01/21 140/67    Wt Readings from Last 3 Encounters:  10/05/22 146 lb 3.2 oz (66.3 kg)  04/06/22 149 lb 12.8 oz (67.9 kg)  10/01/21 152 lb 3.2 oz (69 kg)     Physical Exam Constitutional:      General: She is not in acute distress.    Appearance: She is well-developed.  Cardiovascular:     Rate and Rhythm: Normal rate and regular rhythm.  Pulmonary:     Breath sounds: Normal breath sounds.  Musculoskeletal:        General: Normal range of motion.  Skin:    General: Skin is warm and dry.  Neurological:     Mental Status: She is alert and oriented to person, place, and time.       Assessment & Plan:   Savannah Dean was seen today for medical management of chronic issues.  Diagnoses and all orders for this visit:  Hyperlipidemia, unspecified hyperlipidemia type  Essential hypertension  Numbness in feet  Juvenile idiopathic scoliosis of thoracolumbar region       I  have discontinued Loise Roske. Korff's methocarbamol and predniSONE. I am also having her maintain her diclofenac sodium, EPINEPHrine, ProAir RespiClick, aspirin EC, multivitamin with minerals, docusate sodium, polyethylene glycol, senna-docusate, acetaminophen, fluticasone, chlorhexidine, azelastine, omeprazole, valsartan, alendronate, ketoconazole, Claritin-D 12 Hour, meloxicam, tiZANidine, neomycin-polymyxin b-dexamethasone, metroNIDAZOLE, gabapentin, traMADol, and citalopram.  Allergies as of 10/05/2022       Reactions   Molds & Smuts         Medication List        Accurate as of Oct 05, 2022  2:01 PM. If you have any questions, ask your nurse or doctor.          STOP taking these medications    methocarbamol 500 MG  tablet Commonly known as: ROBAXIN Stopped by: Mechele Claude, MD   predniSONE 50 MG tablet Commonly known as: DELTASONE Stopped by: Mechele Claude, MD       TAKE these medications    acetaminophen 325 MG tablet Commonly known as: TYLENOL Take 1-2 tablets (325-650 mg total) by mouth every 6 (six) hours as needed for mild pain (pain score 1-3 or temp > 100.5).   alendronate 70 MG tablet Commonly known as: FOSAMAX TAKE 1 TABLET BY MOUTH EVERY SATURDAY   aspirin EC 325 MG tablet Take 1 tablet (325 mg total) by mouth daily with breakfast.   azelastine 0.05 % ophthalmic solution Commonly known as: OPTIVAR Place 1 drop into both eyes 2 (two) times daily.   chlorhexidine 0.12 % solution Commonly known as: PERIDEX SMARTSIG:By Mouth   citalopram 40 MG tablet Commonly known as: CELEXA TAKE 1&1/2 TABLET BY MOUTH DAILY   Claritin-D 12 Hour 5-120 MG tablet Generic drug: loratadine-pseudoephedrine Take 1 tablet by mouth 2 (two) times daily.   diclofenac sodium 1 % Gel Commonly known as: VOLTAREN Apply 4 g topically 4 (four) times daily.   docusate sodium 100 MG capsule Commonly known as: COLACE Take 1 capsule (100 mg total) by mouth 2 (two) times daily.   EPINEPHrine 0.3 mg/0.3 mL Soaj injection Commonly known as: EPI-PEN Inject 0.3 mg into the muscle as needed for anaphylaxis.   fluticasone 50 MCG/ACT nasal spray Commonly known as: FLONASE Place 1 spray into both nostrils daily as needed for allergies.   gabapentin 100 MG capsule Commonly known as: NEURONTIN Take 2 capsules (200 mg total) by mouth 2 (two) times daily.   ketoconazole 2 % cream Commonly known as: NIZORAL Apply topically 2 (two) times daily.   meloxicam 15 MG tablet Commonly known as: MOBIC TAKE ONE (1) TABLET EACH DAY   metroNIDAZOLE 0.75 % cream Commonly known as: METROCREAM APPLY TWICE DAILY   multivitamin with minerals Tabs tablet Take 1 tablet by mouth daily.   neomycin-polymyxin  b-dexamethasone 3.5-10000-0.1 Oint Commonly known as: MAXITROL APPLY 3 TIMES DAILY AS DIRECTED   omeprazole 20 MG capsule Commonly known as: PRILOSEC TAKE ONE (1) CAPSULE EACH DAY   polyethylene glycol 17 g packet Commonly known as: MIRALAX / GLYCOLAX Take 17 g by mouth daily.   ProAir RespiClick 108 (90 Base) MCG/ACT Aepb Generic drug: Albuterol Sulfate Inhale 2 puffs into the lungs every 6 (six) hours as needed (for wheezing).   senna-docusate 8.6-50 MG tablet Commonly known as: Senokot-S Take 1 tablet by mouth at bedtime.   tiZANidine 4 MG tablet Commonly known as: ZANAFLEX TAKE 1 TABLET EVERY 8 HOURS AS NEEDED FOR MUSCLE SPASMS   traMADol 50 MG tablet Commonly known as: ULTRAM Take 1-2 tablets (50-100 mg total) by mouth every 12 (twelve)  hours as needed.   valsartan 160 MG tablet Commonly known as: DIOVAN TAKE ONE (1) TABLET BY MOUTH EVERY DAY       Pt. Declined trial of lyrica. Declined recommendation regarding BP.  Follow-up: No follow-ups on file.  Mechele Claude, M.D.

## 2022-10-15 ENCOUNTER — Other Ambulatory Visit: Payer: Self-pay | Admitting: Family Medicine

## 2022-10-15 DIAGNOSIS — F339 Major depressive disorder, recurrent, unspecified: Secondary | ICD-10-CM

## 2022-11-06 ENCOUNTER — Other Ambulatory Visit: Payer: Self-pay | Admitting: Family Medicine

## 2022-11-06 DIAGNOSIS — K219 Gastro-esophageal reflux disease without esophagitis: Secondary | ICD-10-CM

## 2022-11-25 ENCOUNTER — Other Ambulatory Visit: Payer: Self-pay | Admitting: Family Medicine

## 2022-11-25 DIAGNOSIS — M41115 Juvenile idiopathic scoliosis, thoracolumbar region: Secondary | ICD-10-CM

## 2023-01-05 ENCOUNTER — Other Ambulatory Visit: Payer: Self-pay | Admitting: Family Medicine

## 2023-01-05 DIAGNOSIS — F339 Major depressive disorder, recurrent, unspecified: Secondary | ICD-10-CM

## 2023-02-04 DIAGNOSIS — H5213 Myopia, bilateral: Secondary | ICD-10-CM | POA: Diagnosis not present

## 2023-02-08 ENCOUNTER — Other Ambulatory Visit: Payer: Self-pay | Admitting: Family Medicine

## 2023-03-05 ENCOUNTER — Other Ambulatory Visit: Payer: Self-pay | Admitting: Family Medicine

## 2023-03-05 ENCOUNTER — Other Ambulatory Visit: Payer: Self-pay | Admitting: Orthopaedic Surgery

## 2023-03-15 ENCOUNTER — Other Ambulatory Visit: Payer: Self-pay

## 2023-03-15 ENCOUNTER — Ambulatory Visit: Payer: Medicare Other | Admitting: Internal Medicine

## 2023-03-15 ENCOUNTER — Encounter: Payer: Self-pay | Admitting: Internal Medicine

## 2023-03-15 VITALS — BP 140/78 | HR 64 | Temp 98.0°F | Ht 58.5 in | Wt 143.0 lb

## 2023-03-15 DIAGNOSIS — L501 Idiopathic urticaria: Secondary | ICD-10-CM | POA: Diagnosis not present

## 2023-03-15 DIAGNOSIS — J3089 Other allergic rhinitis: Secondary | ICD-10-CM | POA: Diagnosis not present

## 2023-03-15 MED ORDER — FLUTICASONE PROPIONATE 50 MCG/ACT NA SUSP: 2.0000 | Freq: Every day | NASAL | 5 refills | Status: AC

## 2023-03-15 MED ORDER — FEXOFENADINE HCL 180 MG PO TABS: 180.0000 mg | ORAL_TABLET | Freq: Two times a day (BID) | ORAL | 5 refills | Status: AC | PRN

## 2023-03-15 NOTE — Progress Notes (Signed)
NEW PATIENT  Date of Service/Encounter:  03/15/23  Consult requested by: Mechele Claude, MD   Subjective:   Savannah Dean (DOB: 16-Mar-1946) is a 77 y.o. female who presents to the clinic on 03/15/2023 with a chief complaint of Establish Care, Urticaria, Pruritus (Wants to see if she maybe allergic to wheat.), and Nasal Congestion .    History obtained from: chart review and patient.   Hives:  Has a prior history of this but more recently started in Sept 2024.  Seen by Dermatologist also.  Reports being prescribed ketoconazole cream which does help.  Also taking Allegra that helps.  Takes that once a day.  Hives are raised, itchy, red.  She is worried about a food allergy, possibly wheat/chocolate.  Also has had arm swelling with this once.    Rhinitis:  Started in childhood.  Symptoms include: nasal congestion, rhinorrhea, and post nasal drainage  Occurs year-round Potential triggers: mold   Treatments tried:  Phenylephrine nose spray Allegra Previously was on AIT for almost 8 years with [facility]   Previous allergy testing: yes History of sinus surgery: yes- turbinate reduction  Nonallergic triggers: none   Past Medical History: Past Medical History:  Diagnosis Date   Arthritis    Depression    HOH (hard of hearing)    Hypertension    Urticaria    Past Surgical History: Past Surgical History:  Procedure Laterality Date   INTRAMEDULLARY (IM) NAIL INTERTROCHANTERIC Left 12/10/2019   Procedure: INTRAMEDULLARY (IM) NAIL SUBTROCHANTRIC;  Surgeon: Kathryne Hitch, MD;  Location: [facility] OR;  Service: Orthopedics;  Laterality: Left;   NO PAST SURGERIES     SPINAL FUSION  1950    Family History: Family History  Problem Relation Age of Onset   Hypertension Mother    Allergic rhinitis Father    Hypertension Father    Diabetes Brother    Diabetes Maternal Grandfather     Social History:  Flooring in bedroom: carpet Pets: none Tobacco use/exposure:  none Job: retired   Medication List:  Allergies as of 03/15/2023       Reactions   Molds & Smuts         Medication List        Accurate as of March 15, 2023  2:32 PM. If you have any questions, ask your nurse or doctor.          acetaminophen 325 MG tablet Commonly known as: TYLENOL Take 1-2 tablets (325-650 mg total) by mouth every 6 (six) hours as needed for mild pain (pain score 1-3 or temp > 100.5).   alendronate 70 MG tablet Commonly known as: FOSAMAX TAKE 1 TABLET BY MOUTH EVERY SATURDAY   aspirin EC 325 MG tablet Take 1 tablet (325 mg total) by mouth daily with breakfast.   azelastine 0.05 % ophthalmic solution Commonly known as: OPTIVAR Place 1 drop into both eyes 2 (two) times daily.   chlorhexidine 0.12 % solution Commonly known as: PERIDEX SMARTSIG:By Mouth   citalopram 40 MG tablet Commonly known as: CELEXA TAKE 1&1/2 TABLET BY MOUTH DAILY   Claritin-D 12 Hour 5-120 MG tablet Generic drug: loratadine-pseudoephedrine Take 1 tablet by mouth 2 (two) times daily.   diclofenac sodium 1 % Gel Commonly known as: VOLTAREN Apply 4 g topically 4 (four) times daily.   docusate sodium 100 MG capsule Commonly known as: COLACE Take 1 capsule (100 mg total) by mouth 2 (two) times daily.   EPINEPHrine 0.3 mg/0.3 mL Soaj injection Commonly known  as: EPI-PEN Inject 0.3 mg into the muscle as needed for anaphylaxis.   fluticasone 50 MCG/ACT nasal spray Commonly known as: FLONASE Place 1 spray into both nostrils daily as needed for allergies.   gabapentin 300 MG capsule Commonly known as: NEURONTIN Take 1 capsule (300 mg total) by mouth 2 (two) times daily.   ketoconazole 2 % cream Commonly known as: NIZORAL APPLY TWICE DAILY   meloxicam 15 MG tablet Commonly known as: MOBIC TAKE ONE (1) TABLET EACH DAY   metroNIDAZOLE 0.75 % cream Commonly known as: METROCREAM APPLY TWICE DAILY   multivitamin with minerals Tabs tablet Take 1 tablet by mouth  daily.   neomycin-polymyxin b-dexamethasone 3.5-10000-0.1 Oint Commonly known as: MAXITROL APPLY 3 TIMES DAILY AS DIRECTED   omeprazole 20 MG capsule Commonly known as: PRILOSEC TAKE ONE (1) CAPSULE EACH DAY   polyethylene glycol 17 g packet Commonly known as: MIRALAX / GLYCOLAX Take 17 g by mouth daily.   predniSONE 50 MG tablet Commonly known as: DELTASONE TAKE 1 TABLET BY MOUTH DAILY FOR 5 DAYS   ProAir RespiClick 108 (90 Base) MCG/ACT Aepb Generic drug: Albuterol Sulfate Inhale 2 puffs into the lungs every 6 (six) hours as needed (for wheezing).   senna-docusate 8.6-50 MG tablet Commonly known as: Senokot-S Take 1 tablet by mouth at bedtime.   tiZANidine 4 MG tablet Commonly known as: ZANAFLEX TAKE 1 TABLET EVERY 8 HOURS AS NEEDED FOR MUSCLE SPASMS   traMADol 50 MG tablet Commonly known as: ULTRAM Take 1-2 tablets (50-100 mg total) by mouth every 12 (twelve) hours as needed.   valsartan 160 MG tablet Commonly known as: DIOVAN TAKE ONE (1) TABLET BY MOUTH EVERY DAY         REVIEW OF SYSTEMS: Pertinent positives and negatives discussed in HPI.   Objective:   Physical Exam: BP (!) 140/78   Pulse 64   Temp 98 F (36.7 C)   Ht 4' 10.5" (1.486 m)   Wt 143 lb (64.9 kg)   SpO2 96%   BMI 29.38 kg/m  Body mass index is 29.38 kg/m. GEN: alert, well developed HEENT: clear conjunctiva, nose with + mild inferior turbinate hypertrophy, pink nasal mucosa, slight clear rhinorrhea, + cobblestoning HEART: regular rate and rhythm, no murmur LUNGS: clear to auscultation bilaterally, no coughing, unlabored respiration ABDOMEN: soft, non distended  SKIN: excoriations noted on upper back   Reviewed:  10/05/2022: seen by Dr Broadus John for HTN, HLD, scoliosis, neuropathy, GERD.  On Flonase, Azelastine, Claritin D.   06/10/2022: seen by Ortho Dr Magnus Ivan for femur fracture and lumbar arthritis.  Plan to do a course of oral prednisone.   Assessment:   1. Other allergic  rhinitis   2. Idiopathic urticaria     Plan/Recommendations:  Idiopathic Urticaria (Hives): - At this time etiology of hives and swelling is unknown. Hives can be caused by a variety of different triggers including illness/infection, exercise, pressure, vibrations, extremes of temperature to name a few however majority of the time there is no identifiable trigger.  - We will do testing for common foods at next visit.  Other Allergic Rhinitis: - Due to turbinate hypertrophy, seasonal symptoms and unresponsive to over the counter meds, will perform skin testing to identify aeroallergen triggers.    - Use nasal saline rinses before nose sprays such as with Neilmed Sinus Rinse.  Use distilled water.   - Avoid use of phenylephrine nose spray.  - Use Flonase 2 sprays each nostril daily. Aim upward and outward. -  Use Allegra 180mg  daily.  Hold this for at least 1-2 days prior to next visit.     Follow up: 10/21 at 10 AM for skin testing 1-68 + chocolate      No follow-ups on file.  Alesia Morin, MD [facility]

## 2023-03-15 NOTE — Patient Instructions (Addendum)
Idiopathic Urticaria (Hives): - At this time etiology of hives and swelling is unknown. Hives can be caused by a variety of different triggers including illness/infection, exercise, pressure, vibrations, extremes of temperature to name a few however majority of the time there is no identifiable trigger.  - We will do testing for common foods at next visit.  Other Allergic Rhinitis: - Use nasal saline rinses before nose sprays such as with Neilmed Sinus Rinse.  Use distilled water.   - Avoid use of phenylephrine nose spray.  - Use Flonase 2 sprays each nostril daily. Aim upward and outward. - Use Allegra 180mg  daily.  Hold this for at least 1-2 days prior to next visit.      Follow up: 10/21 at 10 AM with me for skin testing

## 2023-03-19 ENCOUNTER — Other Ambulatory Visit: Payer: Self-pay | Admitting: Family Medicine

## 2023-03-19 DIAGNOSIS — R200 Anesthesia of skin: Secondary | ICD-10-CM

## 2023-03-22 ENCOUNTER — Ambulatory Visit: Payer: Medicare Other | Admitting: Internal Medicine

## 2023-03-22 ENCOUNTER — Encounter: Payer: Self-pay | Admitting: Internal Medicine

## 2023-03-22 ENCOUNTER — Other Ambulatory Visit: Payer: Self-pay

## 2023-03-22 VITALS — BP 120/72 | HR 78 | Temp 97.6°F | Wt 142.0 lb

## 2023-03-22 DIAGNOSIS — J310 Chronic rhinitis: Secondary | ICD-10-CM | POA: Diagnosis not present

## 2023-03-22 DIAGNOSIS — L500 Allergic urticaria: Secondary | ICD-10-CM | POA: Diagnosis not present

## 2023-03-22 NOTE — Progress Notes (Signed)
FOLLOW UP Date of Service/Encounter:  03/22/23   Subjective:  Savannah Dean (DOB: 05-25-1946) is a 77 y.o. female who [facility] for follow up for testing.   History obtained from: chart review and patient.  Anti histamines held. Wondering if the local water is causing her hives.   Past Medical History: Past Medical History:  Diagnosis Date   Arthritis    Depression    HOH (hard of hearing)    Hypertension    Urticaria     Objective:  BP 120/72   Pulse 78   Temp 97.6 F (36.4 C)   Wt 142 lb (64.4 kg)   SpO2 96%   BMI 29.17 kg/m  Body mass index is 29.17 kg/m. Physical Exam: GEN: alert, well developed HEENT: clear conjunctiva, MMM HEART: regular rate  LUNGS:no coughing, unlabored respiration SKIN: few hives noted on lower back   Skin Testing:  Skin prick testing was placed, which includes aeroallergens/foods, histamine control, and saline control.  Verbal consent was obtained prior to placing test.  Patient tolerated procedure well.  Allergy testing results were read and interpreted by myself, documented by clinical staff. Adequate positive and negative control.  Positive results to:  Results discussed with patient/family.  Airborne Adult Perc - 03/22/23 1004     Time Antigen Placed 1004    Allergen Manufacturer Waynette Buttery    Location Back    Number of Test 55    1. Control-Buffer 50% Glycerol Negative    2. Control-Histamine 3+    3. Bahia Negative    4. French Southern Territ[place] Negative    5. Johnson Negative    6. Kentucky Blue Negative    7. Meadow Fescue Negative    8. Perennial Rye Negative    9. Timothy Negative    10. Ragweed Mix Negative    11. Cocklebur Negative    12. Plantain,  English Negative    13. Baccharis Negative    14. Dog Fennel Negative    15. Russian Thistle Negative    16. Lamb's Quarters Negative    17. Sheep Sorrell Negative    18. Rough Pigweed Negative    19. Marsh Elder, Rough Negative     20. Mugwort, Common Negative    21. Box, Elder Negative    22. Cedar, red Negative    23. Sweet Gum Negative    24. Pecan Pollen Negative    25. Pine Mix Negative    26. Walnut, Black Pollen Negative    27. Red Mulberry Negative    28. Ash Mix Negative    29. Birch Mix Negative    30. Beech American Negative    31. Cottonwood, Guinea-Bissau Negative    32. Hickory, White Negative    33. Maple Mix Negative    34. Oak, Guinea-[place] Mix Negative    35. Sycamore Eastern Negative    36. Alternaria Alternata Negative    37. Cladosporium Herbarum Negative    38. Aspergillus Mix Negative    39. Penicillium Mix Negative    40. Bipolaris Sorokiniana (Helminthosporium) Negative    41. Drechslera Spicifera (Curvularia) Negative    42. Mucor Plumbeus Negative    43. Fusarium Moniliforme Negative    44. Aureobasidium Pullulans (pullulara) Negative    45. Rhizopus Oryzae Negative    46. Botrytis Cinera Negative    47. Epicoccum Nigrum Negative    48. Phoma Betae Negative    49. Dust Mite Mix Negative  50. Cat Hair 10,000 BAU/ml Negative    51.  Dog Epithelia Negative    52. Mixed Feathers Negative    53. Horse Epithelia Negative    54. Cockroach, German Negative    55. Tobacco Leaf Negative             Food Adult Perc - 03/22/23 1000     Time Antigen Placed 1005    Allergen Manufacturer Greer    Location Back    Number of allergen test 14    1. Peanut Negative    2. Soybean Negative    3. Wheat Negative    4. Sesame Negative    5. Milk, Cow Negative    6. Casein Negative    7. Egg White, Chicken Negative    8. Shellfish Mix Negative    9. Fish Mix Negative    10. Cashew Negative    11. Walnut Food Negative    12. Almond Negative    13. Hazelnut Negative    66. Chocolate/Cacao Bean Negative              Assessment:   1. Allergic urticaria   2. Chronic rhinitis     Plan/Recommendations:  Idiopathic Urticaria (Hives): - At this time etiology of hives and swelling is  unknown. Hives can be caused by a variety of different triggers including illness/infection, exercise, pressure, vibrations, extremes of temperature to name a few however majority of the time there is no identifiable trigger.  - SPT 03/2023: negative to common foods and chocolate.  - Use Allegra 180mg  daily.   - If hives, persist, increase to Allegra 180mg  twice daily.   - We discussed at length that many times we cannot figure out the cause for hives but she kept asking why she had the hives.  We also discussed that water chemicals are not associated with hives; there is no testing for this. If she has trouble with local water, she can try bottle spring water to see if it helps.   Chronic Rhinitis: - Due to turbinate hypertrophy, seasonal symptoms and unresponsive to over the counter meds, will perform skin testing to identify aeroallergen triggers.  - SPT 03/2023: none - Use nasal saline rinses before nose sprays such as with Neilmed Sinus Rinse.  Use distilled water.   - Avoid use of phenylephrine nose spray.  - Use Flonase 2 sprays each nostril daily. Aim upward and outward. - Use Allegra 180mg  daily.  Hold this for at least 1-2 days prior to next visit.        Return in about 4 months (around 07/23/2023).  Alesia Morin, MD [facility]

## 2023-03-22 NOTE — Patient Instructions (Addendum)
Urticaria (Hives): - At this time etiology of hives and swelling is unknown. Hives can be caused by a variety of different triggers including illness/infection, exercise, pressure, vibrations, extremes of temperature to name a few however majority of the time there is no identifiable trigger.  - SPT 03/2023: negative to common foods and chocolate.  - Use Allegra 180mg  daily.   - If hives, persist, increase to Allegra 180mg  twice daily.    Chronic Rhinitis: - SPT 03/2023: none - Use nasal saline rinses before nose sprays such as with Neilmed Sinus Rinse.  Use distilled water.   - Avoid use of phenylephrine nose spray.  - Use Flonase 2 sprays each nostril daily. Aim upward and outward. - Use Allegra 180mg  daily.  Hold this for at least 1-2 days prior to next visit.

## 2023-04-01 ENCOUNTER — Other Ambulatory Visit: Payer: Self-pay | Admitting: Family Medicine

## 2023-04-07 ENCOUNTER — Ambulatory Visit: Payer: Medicare Other | Admitting: Family Medicine

## 2023-04-07 ENCOUNTER — Encounter: Payer: Self-pay | Admitting: Family Medicine

## 2023-04-07 VITALS — BP 124/65 | HR 84 | Temp 98.1°F | Ht 58.5 in | Wt 143.8 lb

## 2023-04-07 DIAGNOSIS — E785 Hyperlipidemia, unspecified: Secondary | ICD-10-CM

## 2023-04-07 DIAGNOSIS — M41115 Juvenile idiopathic scoliosis, thoracolumbar region: Secondary | ICD-10-CM

## 2023-04-07 DIAGNOSIS — K2100 Gastro-esophageal reflux disease with esophagitis, without bleeding: Secondary | ICD-10-CM

## 2023-04-07 DIAGNOSIS — F339 Major depressive disorder, recurrent, unspecified: Secondary | ICD-10-CM

## 2023-04-07 DIAGNOSIS — E559 Vitamin D deficiency, unspecified: Secondary | ICD-10-CM | POA: Diagnosis not present

## 2023-04-07 DIAGNOSIS — I10 Essential (primary) hypertension: Secondary | ICD-10-CM

## 2023-04-07 DIAGNOSIS — R200 Anesthesia of skin: Secondary | ICD-10-CM

## 2023-04-07 DIAGNOSIS — H9190 Unspecified hearing loss, unspecified ear: Secondary | ICD-10-CM

## 2023-04-07 MED ORDER — CITALOPRAM HYDROBROMIDE 40 MG PO TABS: 40.0000 mg | ORAL_TABLET | Freq: Every day | ORAL | 3 refills | Status: DC

## 2023-04-07 MED ORDER — VALSARTAN 160 MG PO TABS: ORAL_TABLET | ORAL | 3 refills | Status: DC

## 2023-04-07 MED ORDER — RISEDRONATE SODIUM 150 MG PO TABS: 150.0000 mg | ORAL_TABLET | ORAL | 3 refills | Status: DC

## 2023-04-07 MED ORDER — GABAPENTIN 300 MG PO CAPS: 300.0000 mg | ORAL_CAPSULE | Freq: Two times a day (BID) | ORAL | 3 refills | Status: DC

## 2023-04-07 NOTE — Progress Notes (Signed)
Subjective:  Patient ID: Savannah Dean, female    DOB: [birth year]  Age: 77 y.o. MRN: [id number]  CC: Medical Management of Chronic Issues   HPI Savannah Dean presents for scoliosis pain. Managing with gaba and occasional ibuprofen. Not taking meloxicam or tramadol.   Patient in for follow-up of GERD. Currently asymptomatic taking  PPI daily. There is no chest pain or heartburn. No hematemesis and no melena. No dysphagia or choking. Onset is remote. Progression is stable. Complicating factors, none.   presents for  follow-up of hypertension. Patient has no history of headache chest pain or shortness of breath or recent cough. Patient also denies symptoms of TIA such as focal numbness or weakness. Patient denies side effects from medication. States taking it regularly. Due for check of cholesterol. Not taking medication currently.        04/07/2023    1:39 PM 04/07/2023    1:35 PM 10/05/2022    1:06 PM  Depression screen PHQ 2/9  Decreased Interest 0 0 0  Down, Depressed, Hopeless 0 0 0  PHQ - 2 Score 0 0 0    History Savannah Dean has a past medical history of Arthritis, Depression, HOH (hard of hearing), Hypertension, and Urticaria.   She has a past surgical history that includes Spinal fusion (1950); No past surgeries; and Intramedullary (im) nail intertrochanteric (Left, 12/10/2019).   Her family history includes Allergic rhinitis in her father; Diabetes in her brother and maternal grandfather; Hypertension in her father and mother.She reports that she has never smoked. She has never been exposed to tobacco smoke. She has never used smokeless tobacco. She reports that she does not drink alcohol and does not use drugs.    ROS Review of Systems  Constitutional: Negative.   HENT: Negative.    Eyes:  Negative for visual disturbance.  Respiratory:  Negative for shortness of breath.   Cardiovascular:  Negative for chest pain.  Gastrointestinal:  Negative for abdominal pain.   Musculoskeletal:  Positive for back pain and gait problem. Negative for arthralgias.    Objective:  BP 124/65   Pulse 84   Temp 98.1 F (36.7 C)   Ht 4' 10.5" (1.486 m)   Wt 143 lb 12.8 oz (65.2 kg)   SpO2 91%   BMI 29.54 kg/m   BP Readings from Last 3 Encounters:  04/07/23 124/65  03/22/23 120/72  03/15/23 (!) 140/78    Wt Readings from Last 3 Encounters:  04/07/23 143 lb 12.8 oz (65.2 kg)  03/22/23 142 lb (64.4 kg)  03/15/23 143 lb (64.9 kg)     Physical Exam Constitutional:      General: She is not in acute distress.    Appearance: She is well-developed.  Cardiovascular:     Rate and Rhythm: Normal rate and regular rhythm.  Pulmonary:     Breath sounds: Normal breath sounds.  Musculoskeletal:        General: Normal range of motion.     Comments: Scolioisis changes in spine. Pain relief with gabapentin. Has to use a walker, stooped.  Skin:    General: Skin is warm and dry.  Neurological:     Mental Status: She is alert and oriented to person, place, and time.       Assessment & Plan:   Savannah Dean was seen today for medical management of chronic issues.  Diagnoses and all orders for this visit:  Hyperlipidemia, unspecified hyperlipidemia type -     Lipid panel  Essential hypertension -  CBC with Differential/Platelet -     CMP14+EGFR  Depression, recurrent (HCC) -     citalopram (CELEXA) 40 MG tablet; Take 1 tablet (40 mg total) by mouth daily.  Numbness in feet -     gabapentin (NEURONTIN) 300 MG capsule; Take 1 capsule (300 mg total) by mouth 2 (two) times daily.  Juvenile idiopathic scoliosis of thoracolumbar region -     VITAMIN D 25 Hydroxy (Vit-D Deficiency, Fractures)  Vitamin D deficiency -     VITAMIN D 25 Hydroxy (Vit-D Deficiency, Fractures)  Gastroesophageal reflux disease with esophagitis without hemorrhage  Hard of hearing  Other orders -     risedronate (ACTONEL) 150 MG tablet; Take 1 tablet (150 mg total) by mouth every 30  (thirty) days. with water on empty stomach, nothing by mouth or lie down for next 30 minutes. -     valsartan (DIOVAN) 160 MG tablet; TAKE ONE (1) TABLET BY MOUTH EVERY DAY       I have discontinued Savannah Dean. [name] and predniSONE. [name]. [name]. Additionally, [name], EPINEPHrine, ProAir RespiClick, aspirin EC, multivitamin with minerals, docusate sodium, polyethylene glycol, senna-docusate, chlorhexidine, azelastine, Claritin-D 12 Hour, neomycin-polymyxin b-dexamethasone, metroNIDAZOLE, traMADol, omeprazole, tiZANidine, fluticasone, fexofenadine, ketoconazole, and valsartan.  Allergies as of 04/07/2023       Reactions   Molds & Smuts         Medication List        Accurate as of April 07, 2023  2:25 PM. If you have any questions, ask your nurse or doctor.          STOP taking these medications    alendronate 70 MG tablet Commonly known as: FOSAMAX Replaced by: risedronate 150 MG tablet Stopped by: Savannah Dean   meloxicam 15 MG tablet Commonly known as: MOBIC Stopped by: Savannah Dean   predniSONE 50 MG tablet Commonly known as: DELTASONE Stopped by: Savannah Dean       TAKE these medications    aspirin EC 325 MG tablet Take 1 tablet (325 mg total) by mouth daily with breakfast.   azelastine 0.05 % ophthalmic solution Commonly known as: OPTIVAR Place 1 drop into both eyes 2 (two) times daily.   chlorhexidine 0.12 % solution Commonly known as: PERIDEX SMARTSIG:By Mouth   citalopram 40 MG tablet Commonly known as: CELEXA Take 1 tablet (40 mg total) by mouth daily. What changed: See the new instructions. Changed by: Savannah Dean   Claritin-D 12 Hour 5-120 MG tablet Generic drug: loratadine-pseudoephedrine Take 1 tablet by mouth 2 (two) times daily.   diclofenac sodium 1 % Gel Commonly known as: VOLTAREN Apply 4  g topically 4 (four) times daily.   docusate sodium 100 MG capsule Commonly known as: COLACE Take 1 capsule (100 mg total) by mouth 2 (two) times daily.   EPINEPHrine 0.3 mg/0.3 mL Soaj injection Commonly known as: EPI-PEN Inject 0.3 mg into the muscle as needed for anaphylaxis.   fexofenadine 180 MG tablet Commonly known as: Allegra Allergy Take 1 tablet (180 mg total) by mouth 2 (two) times daily as needed for allergies (hives).   fluticasone 50 MCG/ACT nasal spray Commonly known as: FLONASE Place 2 sprays into both nostrils daily.   gabapentin 300 MG capsule Commonly known as: NEURONTIN Take 1 capsule (300 mg total) by mouth 2 (two) times daily. What changed: See the new instructions. Changed by: [employer]  ketoconazole 2 % cream Commonly known as: NIZORAL APPLY TWICE DAILY   metroNIDAZOLE 0.75 % cream Commonly known as: METROCREAM APPLY TWICE DAILY   multivitamin with minerals Tabs tablet Take 1 tablet by mouth daily.   neomycin-polymyxin b-dexamethasone 3.5-10000-0.1 Oint Commonly known as: MAXITROL APPLY 3 TIMES DAILY AS DIRECTED   omeprazole 20 MG capsule Commonly known as: PRILOSEC TAKE ONE (1) CAPSULE EACH DAY   polyethylene glycol 17 g packet Commonly known as: MIRALAX / GLYCOLAX Take 17 g by mouth daily.   ProAir RespiClick 108 (90 Base) MCG/ACT Aepb Generic drug: Albuterol Sulfate Inhale 2 puffs into the lungs every 6 (six) hours as needed (for wheezing).   risedronate 150 MG tablet Commonly known as: ACTONEL Take 1 tablet (150 mg total) by mouth every 30 (thirty) days. with water on empty stomach, nothing by mouth or lie down for next 30 minutes. Replaces: alendronate 70 MG tablet Started by: Rayn Enderson   senna-docusate 8.6-50 MG tablet Commonly known as: Senokot-S Take 1 tablet by mouth at bedtime.   tiZANidine 4 MG tablet Commonly known as: ZANAFLEX TAKE 1 TABLET EVERY 8 HOURS AS NEEDED FOR MUSCLE SPASMS   traMADol 50 MG  tablet Commonly known as: ULTRAM Take 1-2 tablets (50-100 mg total) by mouth every 12 (twelve) hours as needed.   valsartan 160 MG tablet Commonly known as: DIOVAN TAKE ONE (1) TABLET BY MOUTH EVERY DAY What changed:  how to take this when to take this additional instructions Changed by: Broadus John Christopher Glasscock         Follow-up: Return in about 6 months (around 10/05/2023).  Mechele Claude, M.D.

## 2023-04-08 LAB — CMP14+EGFR
ALT: 10 [IU]/L (ref 0–32)
AST: 17 [IU]/L (ref 0–40)
Albumin: 4.4 g/dL (ref 3.8–4.8)
Alkaline Phosphatase: 86 [IU]/L (ref 44–121)
BUN/Creatinine Ratio: 15 (ref 12–28)
BUN: 14 mg/dL (ref 8–27)
Bilirubin Total: 0.4 mg/dL (ref 0.0–1.2)
CO2: 23 mmol/L (ref 20–29)
Calcium: 9.7 mg/dL (ref 8.7–10.3)
Chloride: 101 mmol/L (ref 96–106)
Creatinine, Ser: 0.91 mg/dL (ref 0.57–1.00)
Globulin, Total: 2.3 g/dL (ref 1.5–4.5)
Glucose: 79 mg/dL (ref 70–99)
Potassium: 4.5 mmol/L (ref 3.5–5.2)
Sodium: 138 mmol/L (ref 134–144)
Total Protein: 6.7 g/dL (ref 6.0–8.5)
eGFR: 64 mL/min/{1.73_m2} (ref >59)

## 2023-04-08 LAB — CBC WITH DIFFERENTIAL/PLATELET
Basophils Absolute: 0.1 10*3/uL (ref 0.0–0.2)
Basos: 1 %
EOS (ABSOLUTE): 0.2 10*3/uL (ref 0.0–0.4)
Eos: 3 %
Hematocrit: 36.1 % (ref 34.0–46.6)
Hemoglobin: 11.7 g/dL (ref 11.1–15.9)
Immature Grans (Abs): 0 10*3/uL (ref 0.0–0.1)
Immature Granulocytes: 0 %
Lymphocytes Absolute: 1.4 10*3/uL (ref 0.7–3.1)
Lymphs: 20 %
MCH: 30.5 pg (ref 26.6–33.0)
MCHC: 32.4 g/dL (ref 31.5–35.7)
MCV: 94 fL (ref 79–97)
Monocytes Absolute: 0.7 10*3/uL (ref 0.1–0.9)
Monocytes: 9 %
Neutrophils Absolute: 4.8 10*3/uL (ref 1.4–7.0)
Neutrophils: 67 %
Platelets: 198 10*3/uL (ref 150–450)
RBC: 3.83 x10E6/uL (ref 3.77–5.28)
RDW: 12.5 % (ref 11.7–15.4)
WBC: 7.1 10*3/uL (ref 3.4–10.8)

## 2023-04-08 LAB — LIPID PANEL
Chol/HDL Ratio: 2.8 ratio (ref 0.0–4.4)
Cholesterol, Total: 161 mg/dL (ref 100–199)
HDL: 57 mg/dL (ref >39)
LDL Chol Calc (NIH): 87 mg/dL (ref 0–99)
Triglycerides: 94 mg/dL (ref 0–149)
VLDL Cholesterol Cal: 17 mg/dL (ref 5–40)

## 2023-04-08 LAB — VITAMIN D 25 HYDROXY (VIT D DEFICIENCY, FRACTURES): Vit D, 25-Hydroxy: 63.6 ng/mL (ref 30.0–100.0)

## 2023-04-11 NOTE — Progress Notes (Signed)
Hello Savannah Dean,  Your lab result is normal and/or stable.Some minor variations that are not significant are commonly marked abnormal, but do not represent any medical problem for you.  Best regards, Jamile Rekowski, M.D.

## 2023-05-13 ENCOUNTER — Other Ambulatory Visit: Payer: Self-pay | Admitting: Family Medicine

## 2023-05-13 DIAGNOSIS — M41115 Juvenile idiopathic scoliosis, thoracolumbar region: Secondary | ICD-10-CM

## 2023-05-13 NOTE — Telephone Encounter (Signed)
Stacks patient Last office visit 04/05/23 Last refill 11/25/22 #90, 1 refill

## 2023-07-19 ENCOUNTER — Other Ambulatory Visit: Payer: Self-pay | Admitting: Family Medicine

## 2023-07-19 DIAGNOSIS — K219 Gastro-esophageal reflux disease without esophagitis: Secondary | ICD-10-CM

## 2023-07-26 ENCOUNTER — Ambulatory Visit: Payer: Medicare Other | Admitting: Internal Medicine

## 2023-10-04 ENCOUNTER — Ambulatory Visit (INDEPENDENT_AMBULATORY_CARE_PROVIDER_SITE_OTHER): Payer: Medicare Other | Admitting: Family Medicine

## 2023-10-04 ENCOUNTER — Encounter: Payer: Self-pay | Admitting: Family Medicine

## 2023-10-04 VITALS — BP 138/72 | HR 72 | Temp 98.2°F | Ht 58.5 in | Wt 142.0 lb

## 2023-10-04 DIAGNOSIS — K219 Gastro-esophageal reflux disease without esophagitis: Secondary | ICD-10-CM

## 2023-10-04 DIAGNOSIS — F339 Major depressive disorder, recurrent, unspecified: Secondary | ICD-10-CM | POA: Diagnosis not present

## 2023-10-04 DIAGNOSIS — I10 Essential (primary) hypertension: Secondary | ICD-10-CM | POA: Diagnosis not present

## 2023-10-04 DIAGNOSIS — M41115 Juvenile idiopathic scoliosis, thoracolumbar region: Secondary | ICD-10-CM | POA: Diagnosis not present

## 2023-10-04 NOTE — Progress Notes (Signed)
   Subjective:  Patient ID: Savannah Dean, female    DOB: 1945-06-12  Age: 78 y.o. MRN: [id number]  CC: Medical Management of Chronic Issues (No concerns at this time. )   HPI NASHA SERVANTEZ presents for Patient in for follow-up of GERD. Currently asymptomatic taking  PPI daily. There is no chest pain or heartburn. No hematemesis and no melena. No dysphagia or choking. Onset is remote. Progression is stable. Complicating factors, none.  Continues to use citalopram  with good control of depression symptoms.   presents for  follow-up of hypertension. Patient has no history of headache chest pain or shortness of breath or recent cough. Patient also denies symptoms of TIA such as focal numbness or weakness. Patient denies side effects from medication. States taking it regularly.         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   She has a past surgical history that includes Spinal fusion (1950); No past surgeries; and Intramedullary (im) nail intertrochanteric (Left, 12/10/2019).   Her family history includes Allergic rhinitis in her father; Diabetes in her brother and maternal grandfather; Hypertension in her father and mother.She reports that she has never smoked. She has never been exposed to tobacco smoke. She has never used smokeless tobacco. She reports that she does not drink alcohol and does not use drugs.    ROS Review of Systems  Constitutional: Negative.   HENT: Negative.    Eyes:  Negative for  visual disturbance.  Respiratory:  Negative for shortness of breath.   Cardiovascular:  Negative for chest pain.  Gastrointestinal:  Negative for abdominal pain.  Musculoskeletal:  Positive for back pain (as a result of scoliosis). Negative for arthralgias.    Objective:  BP 138/72   Pulse 72   Temp 98.2 F (36.8 C)   Ht 4' 10.5" (1.486 m)   Wt 142 lb (64.4 kg)   SpO2 93%   BMI 29.17 kg/m   BP Readings from Last 3 Encounters:  10/04/23 138/72  04/07/23 124/65  03/22/23 120/72    Wt Readings from Last 3 Encounters:  10/04/23 142 lb (64.4 kg)  04/07/23 143 lb 12.8 oz (65.2 kg)  03/22/23 142 lb (64.4 kg)     Physical Exam Constitutional:      General: She is not in acute distress.    Appearance: She is well-developed.  Cardiovascular:     Rate and Rhythm: Normal rate and regular rhythm.  Pulmonary:     Breath sounds: Normal breath sounds.  Musculoskeletal:        General: Normal range of motion.  Skin:    General: Skin is warm and dry.  Neurological:     Mental Status: She is alert and oriented to person, place, and time.      Assessment & Plan:  Gastroesophageal reflux disease without esophagitis  Juvenile idiopathic scoliosis of thoracolumbar region  Essential hypertension  Depression, recurrent (HCC)     Follow-up: No follow-ups on file.  Roise Cleaver, M.D.

## 2023-11-22 ENCOUNTER — Other Ambulatory Visit: Payer: Self-pay | Admitting: Family Medicine

## 2023-11-22 DIAGNOSIS — M41115 Juvenile idiopathic scoliosis, thoracolumbar region: Secondary | ICD-10-CM

## 2024-02-07 ENCOUNTER — Other Ambulatory Visit: Payer: Self-pay | Admitting: Family Medicine

## 2024-02-07 DIAGNOSIS — F339 Major depressive disorder, recurrent, unspecified: Secondary | ICD-10-CM

## 2024-03-02 ENCOUNTER — Telehealth: Payer: Self-pay

## 2024-03-02 NOTE — Telephone Encounter (Signed)
 Spoke to patient. Letter printed and mailed to patients address as requested

## 2024-03-02 NOTE — Telephone Encounter (Signed)
 DONE

## 2024-03-02 NOTE — Telephone Encounter (Signed)
Please write

## 2024-03-02 NOTE — Telephone Encounter (Signed)
 Copied from CRM [id number]. Topic: General - Other >> Mar 02, 2024  3:23 PM Myrick T wrote: Reason for CRM: patient called stated she need her provider to write a note to her apartment complex [employer] stating she need to park in front of her building and not down the hill due to her disability. Please include her name/apt#. Patient says parking has gotten ou fo control and she is having to park down the hill and its hard for her with her walker.

## 2024-04-04 ENCOUNTER — Other Ambulatory Visit: Payer: Self-pay | Admitting: Family Medicine

## 2024-04-04 DIAGNOSIS — F339 Major depressive disorder, recurrent, unspecified: Secondary | ICD-10-CM

## 2024-04-05 ENCOUNTER — Ambulatory Visit: Payer: Self-pay | Admitting: Family Medicine

## 2024-04-05 ENCOUNTER — Encounter: Payer: Self-pay | Admitting: Family Medicine

## 2024-04-05 VITALS — BP 162/85 | HR 68 | Temp 97.2°F | Ht 58.5 in | Wt 138.0 lb

## 2024-04-05 DIAGNOSIS — M41115 Juvenile idiopathic scoliosis, thoracolumbar region: Secondary | ICD-10-CM

## 2024-04-05 DIAGNOSIS — K219 Gastro-esophageal reflux disease without esophagitis: Secondary | ICD-10-CM

## 2024-04-05 DIAGNOSIS — R200 Anesthesia of skin: Secondary | ICD-10-CM

## 2024-04-05 DIAGNOSIS — L719 Rosacea, unspecified: Secondary | ICD-10-CM

## 2024-04-05 DIAGNOSIS — I10 Essential (primary) hypertension: Secondary | ICD-10-CM

## 2024-04-05 MED ORDER — VALSARTAN 160 MG PO TABS
ORAL_TABLET | ORAL | 3 refills | Status: AC
Start: 2024-04-05 — End: ?

## 2024-04-05 MED ORDER — CLARITIN-D 12 HOUR 5-120 MG PO TB12: 1.0000 | ORAL_TABLET | Freq: Two times a day (BID) | ORAL | 3 refills | Status: AC

## 2024-04-05 MED ORDER — RISEDRONATE SODIUM 150 MG PO TABS: 150.0000 mg | ORAL_TABLET | ORAL | 0 refills | Status: AC

## 2024-04-05 MED ORDER — OMEPRAZOLE 20 MG PO CPDR: DELAYED_RELEASE_CAPSULE | ORAL | 3 refills | Status: AC

## 2024-04-05 MED ORDER — GABAPENTIN 300 MG PO CAPS: 300.0000 mg | ORAL_CAPSULE | Freq: Two times a day (BID) | ORAL | 3 refills | Status: AC

## 2024-04-05 NOTE — Progress Notes (Signed)
 Subjective:  Patient ID: Savannah Dean, female    DOB: 02-Apr-1946  Age: 78 y.o. MRN: [id number]  CC: Medication Refill (pended) and Rash (Redness in stomach folds. Treating with cortisone which helps but won't clear up completely. Pt has a scar there from past surgery. )   HPI  Discussed the use of AI scribe software for clinical note transcription with the patient, who gave verbal consent to proceed.  History of Present Illness Savannah Dean is an 78 year old female who presents for medication management and follow-up.  She has neuropathy, managed with gabapentin  taken twice daily. While on gabapentin , she experiences no significant pain, though she does have cold extremities and numbness in her hands during sleep, which resolves with movement.  She experiences back pain that radiates down her side, particularly when standing for extended periods. The pain is not severe but necessitates sitting down. She occasionally uses tizanidine  at night to help relax before bed, though she notes it is more habitual than necessary. She avoids taking tizanidine  when going out due to a past experience of feeling 'druggy' while driving.  She has a history of falls, including a significant fall onto concrete. She attributes balance issues to long-standing leg weakness and uses a walker for mobility.  She denies current use of tramadol  for pain, opting for ibuprofen as needed. She experiences occasional constipation, managed with a stool softener, and has not been using Miralax  or polyethylene glycol recently.  She has a recurring rash, particularly in the summer, treated with Cortisone 10. The rash causes mild burning but no broken skin. She has experienced weight loss, now weighing 138 pounds, and is concerned about losing muscle mass.          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   She has a past surgical history that includes Spinal fusion (1950); No past surgeries; and Intramedullary (im) nail intertrochanteric (Left, 12/10/2019).   Her family history includes Allergic rhinitis in her father; Diabetes in her brother and maternal grandfather; Hypertension in her father and mother.She reports that she has never smoked. She has never been exposed to tobacco smoke. She has never used smokeless tobacco. She reports that she does not drink alcohol and does not use drugs.    ROS Review of Systems  Constitutional: Negative.   HENT:  Negative for congestion.   Eyes:  Negative for visual disturbance.  Respiratory:  Negative for shortness of breath.   Cardiovascular:  Negative for chest pain.  Gastrointestinal:  Negative for abdominal pain, constipation, diarrhea, nausea and vomiting.  Genitourinary:  Negative for difficulty urinating.  Musculoskeletal:  Positive for arthralgias. Negative for myalgias.  Neurological:  Positive for numbness. Negative for headaches.  Psychiatric/Behavioral:  Negative for sleep disturbance.     Objective:  BP (!) 162/85   Pulse 68   Temp (!) 97.2 F (36.2 C)   Ht 4' 10.5 (1.486 m)   Wt 138 lb (62.6 kg)   SpO2 95%  BMI 28.35 kg/m   BP Readings from Last 3 Encounters:  04/05/24 (!) 162/85  10/04/23 138/72  04/07/23 124/65    Wt Readings from Last 3 Encounters:  04/05/24 138 lb (62.6 kg)  10/04/23 142 lb (64.4 kg)  04/07/23 143 lb 12.8 oz (65.2 kg)     Physical Exam Constitutional:      General: She is not in acute distress.    Appearance: She is  well-developed.  Cardiovascular:     Rate and Rhythm: Normal rate and regular rhythm.  Pulmonary:     Breath sounds: Normal breath sounds.  Musculoskeletal:        General: Normal range of motion.  Skin:    General: Skin is warm and dry.  Neurological:     Mental Status: She is alert and oriented to person, place, and time.    Physical Exam MEASUREMENTS: Weight- 138. GENERAL: Alert, cooperative, well developed, no acute distress. HEENT: Normocephalic, normal oropharynx, moist mucous membranes. CHEST: Clear to auscultation bilaterally, no wheezes, rhonchi, or crackles. CARDIOVASCULAR: Normal heart rate and rhythm, S1 and S2 normal without murmurs. ABDOMEN: Soft, non-tender, non-distended, without organomegaly, normal bowel sounds. EXTREMITIES: No cyanosis or edema. NEUROLOGICAL: Cranial nerves grossly intact, moves all extremities without gross motor or sensory deficit. SKIN: No abnormalities.   Assessment & Plan:  Essential hypertension -     CBC with Differential/Platelet -     CMP14+EGFR  Numbness in feet -     Gabapentin ; Take 1 capsule (300 mg total) by mouth 2 (two) times daily.  Dispense: 180 capsule; Refill: 3 -     CBC with Differential/Platelet -     CMP14+EGFR  Acne rosacea -     CBC with Differential/Platelet -     CMP14+EGFR  Juvenile idiopathic scoliosis of thoracolumbar region -     CBC with Differential/Platelet -     CMP14+EGFR  Gastroesophageal reflux disease without esophagitis -     Omeprazole ; TAKE ONE (1) CAPSULE EACH DAY  Dispense: 90 capsule; Refill: 3 -     CBC with Differential/Platelet -     CMP14+EGFR  Other orders -     Claritin -D 12 Hour; Take 1 tablet by mouth 2 (two) times daily.  Dispense: 180 tablet; Refill: 3 -     Risedronate  Sodium; Take 1 tablet (150 mg total) by mouth every 30 (thirty) days. with water on empty stomach, nothing by mouth or lie down for next 30 minutes.  Dispense: 3 tablet; Refill: 0 -     Valsartan ; TAKE ONE (1)  TABLET BY MOUTH EVERY DAY  Dispense: 90 tablet; Refill: 3    Assessment and Plan Assessment & Plan Juvenile idiopathic scoliosis of thoracolumbar region   Chronic scoliosis presents with intermittent left-sided back pain, worsened by prolonged standing and relieved by sitting. Tizanidine  was discussed for muscle relaxation but is not recommended due to fall risk and side effects like dizziness, grogginess, confusion, and memory issues. Tizanidine  has been discontinued.  Peripheral neuropathy   Chronic peripheral neuropathy is managed with gabapentin , which effectively controls symptoms. She reports cold extremities and occasional numbness in hands during sleep, but experiences no significant pain. Gabapentin  will be continued as prescribed.  Constipation   Chronic constipation is managed with stool softeners. She reports slowed bowel movements but no significant issues. Miralax  or polyethylene glycol was previously recommended by another provider. The current stool softener regimen will be continued.  Heat rash in skin fold   Recurrent heat rash in skin folds is  likely due to heat retention. There is no significant itching or skin breakdown. Previous use of Cortisone 10 cream provided partial relief. Triamcinolone  cream is prescribed for the heat rash.  General Health Maintenance   Routine blood work is due as it has not been done in the last six months. Blood work has been ordered.       Follow-up: Return in about 6 months (around 10/03/2024), or if symptoms worsen or fail to improve.  Butler Der, M.D.

## 2024-04-06 ENCOUNTER — Ambulatory Visit: Payer: Self-pay | Admitting: Family Medicine

## 2024-04-06 LAB — CMP14+EGFR
ALT: 11 IU/L (ref 0–32)
AST: 19 IU/L (ref 0–40)
Albumin: 4.2 g/dL (ref 3.8–4.8)
Alkaline Phosphatase: 92 IU/L (ref 49–135)
BUN/Creatinine Ratio: 12 (ref 12–28)
BUN: 11 mg/dL (ref 8–27)
Bilirubin Total: 0.4 mg/dL (ref 0.0–1.2)
CO2: 25 mmol/L (ref 20–29)
Calcium: 9.6 mg/dL (ref 8.7–10.3)
Chloride: 100 mmol/L (ref 96–106)
Creatinine, Ser: 0.93 mg/dL (ref 0.57–1.00)
Globulin, Total: 2.7 g/dL (ref 1.5–4.5)
Glucose: 84 mg/dL (ref 70–99)
Potassium: 4.2 mmol/L (ref 3.5–5.2)
Sodium: 139 mmol/L (ref 134–144)
Total Protein: 6.9 g/dL (ref 6.0–8.5)
eGFR: 62 mL/min/1.73 (ref >59)

## 2024-04-06 LAB — CBC WITH DIFFERENTIAL/PLATELET
Basophils Absolute: 0.1 x10E3/uL (ref 0.0–0.2)
Basos: 1 %
EOS (ABSOLUTE): 0.1 x10E3/uL (ref 0.0–0.4)
Eos: 1 %
Hematocrit: 37.5 % (ref 34.0–46.6)
Hemoglobin: 12.1 g/dL (ref 11.1–15.9)
Immature Grans (Abs): 0 x10E3/uL (ref 0.0–0.1)
Immature Granulocytes: 0 %
Lymphocytes Absolute: 1.6 x10E3/uL (ref 0.7–3.1)
Lymphs: 25 %
MCH: 29.8 pg (ref 26.6–33.0)
MCHC: 32.3 g/dL (ref 31.5–35.7)
MCV: 92 fL (ref 79–97)
Monocytes Absolute: 0.5 x10E3/uL (ref 0.1–0.9)
Monocytes: 8 %
Neutrophils Absolute: 4.1 x10E3/uL (ref 1.4–7.0)
Neutrophils: 65 %
Platelets: 221 x10E3/uL (ref 150–450)
RBC: 4.06 x10E6/uL (ref 3.77–5.28)
RDW: 13 % (ref 11.7–15.4)
WBC: 6.4 x10E3/uL (ref 3.4–10.8)

## 2024-04-06 NOTE — Progress Notes (Signed)
Hello Savannah Dean,  Your lab result is normal and/or stable.Some minor variations that are not significant are commonly marked abnormal, but do not represent any medical problem for you.  Best regards, Jamile Rekowski, M.D.

## 2024-05-22 ENCOUNTER — Other Ambulatory Visit: Payer: Self-pay | Admitting: Family Medicine

## 2024-06-20 ENCOUNTER — Other Ambulatory Visit: Payer: Self-pay | Admitting: Family Medicine

## 2024-06-20 DIAGNOSIS — F339 Major depressive disorder, recurrent, unspecified: Secondary | ICD-10-CM

## 2024-10-03 ENCOUNTER — Ambulatory Visit: Admitting: Family Medicine
